# Patient Record
Sex: Male | Born: 1955 | Race: White | Hispanic: No | Smoking: Former smoker
Health system: Southern US, Community
[De-identification: ages and names within clinical notes are randomized; demographics above are authoritative.]

## PROBLEM LIST (undated history)

## (undated) DIAGNOSIS — G5600 Carpal tunnel syndrome, unspecified upper limb: Secondary | ICD-10-CM

## (undated) DIAGNOSIS — I480 Paroxysmal atrial fibrillation: Secondary | ICD-10-CM

## (undated) DIAGNOSIS — Z87442 Personal history of urinary calculi: Secondary | ICD-10-CM

## (undated) DIAGNOSIS — K4090 Unilateral inguinal hernia, without obstruction or gangrene, not specified as recurrent: Secondary | ICD-10-CM

## (undated) DIAGNOSIS — C61 Malignant neoplasm of prostate: Secondary | ICD-10-CM

## (undated) DIAGNOSIS — N289 Disorder of kidney and ureter, unspecified: Secondary | ICD-10-CM

## (undated) HISTORY — DX: Unilateral inguinal hernia, without obstruction or gangrene, not specified as recurrent: K40.90

## (undated) HISTORY — PX: ELBOW SURGERY: SHX618

## (undated) HISTORY — DX: Carpal tunnel syndrome, unspecified upper limb: G56.00

## (undated) HISTORY — PX: COLONOSCOPY: SHX174

## (undated) HISTORY — PX: KNEE ARTHROSCOPY: SHX127

## (undated) HISTORY — DX: Malignant neoplasm of prostate: C61

## (undated) HISTORY — DX: Paroxysmal atrial fibrillation: I48.0

---

## 2006-04-11 DIAGNOSIS — Z860100 Personal history of colon polyps, unspecified: Secondary | ICD-10-CM | POA: Insufficient documentation

## 2011-05-04 HISTORY — PX: PROSTATE BIOPSY: SHX241

## 2011-05-15 ENCOUNTER — Telehealth: Payer: Self-pay | Admitting: Internal Medicine

## 2011-05-15 NOTE — Telephone Encounter (Signed)
Forwarded to Dr. Leone Payor for appt referral.

## 2011-05-17 ENCOUNTER — Encounter: Payer: Self-pay | Admitting: Internal Medicine

## 2011-05-25 ENCOUNTER — Encounter: Payer: Self-pay | Admitting: Internal Medicine

## 2011-05-25 ENCOUNTER — Ambulatory Visit (AMBULATORY_SURGERY_CENTER): Payer: 59 | Admitting: *Deleted

## 2011-05-25 VITALS — Ht 68.0 in | Wt 155.0 lb

## 2011-05-25 DIAGNOSIS — Z86010 Personal history of colonic polyps: Secondary | ICD-10-CM

## 2011-05-25 MED ORDER — PEG-KCL-NACL-NASULF-NA ASC-C 100 G PO SOLR: ORAL | Status: DC

## 2011-05-25 NOTE — Progress Notes (Signed)
PCP:  Enzo Montgomery. Buford Dresser, MD            [phone]            [address]             [place], [place] [address]

## 2011-05-25 NOTE — Progress Notes (Signed)
Pt. States he vomited on the way home from his last colon

## 2011-06-04 ENCOUNTER — Other Ambulatory Visit: Payer: Self-pay | Admitting: Internal Medicine

## 2011-06-28 ENCOUNTER — Emergency Department (HOSPITAL_COMMUNITY)
Admission: EM | Admit: 2011-06-28 | Discharge: 2011-06-29 | Disposition: A | Discharge status: Home / Self Care | Payer: 59 | Attending: Emergency Medicine | Admitting: Emergency Medicine

## 2011-06-28 ENCOUNTER — Emergency Department (HOSPITAL_COMMUNITY): Payer: 59

## 2011-06-28 DIAGNOSIS — R109 Unspecified abdominal pain: Secondary | ICD-10-CM | POA: Insufficient documentation

## 2011-06-28 DIAGNOSIS — R112 Nausea with vomiting, unspecified: Secondary | ICD-10-CM | POA: Insufficient documentation

## 2011-06-28 DIAGNOSIS — N201 Calculus of ureter: Secondary | ICD-10-CM | POA: Insufficient documentation

## 2011-06-28 DIAGNOSIS — R300 Dysuria: Secondary | ICD-10-CM | POA: Insufficient documentation

## 2011-06-28 DIAGNOSIS — R1032 Left lower quadrant pain: Secondary | ICD-10-CM | POA: Insufficient documentation

## 2011-06-28 LAB — DIFFERENTIAL
Basophils Absolute: 0 10*3/uL (ref 0.0–0.1)
Basophils Relative: 0 % (ref 0–1)
Eosinophils Absolute: 0.1 10*3/uL (ref 0.0–0.7)
Lymphs Abs: 1 10*3/uL (ref 0.7–4.0)
Neutrophils Relative %: 84 % — ABNORMAL HIGH (ref 43–77)

## 2011-06-28 LAB — CBC
MCH: 33.6 pg (ref 26.0–34.0)
MCV: 90.4 fL (ref 78.0–100.0)
Platelets: 169 10*3/uL (ref 150–400)
RBC: 5 MIL/uL (ref 4.22–5.81)
RDW: 12.6 % (ref 11.5–15.5)
WBC: 10.4 10*3/uL (ref 4.0–10.5)

## 2011-06-28 LAB — COMPREHENSIVE METABOLIC PANEL
AST: 24 U/L (ref 0–37)
Albumin: 4.4 g/dL (ref 3.5–5.2)
BUN: 20 mg/dL (ref 6–23)
CO2: 22 mEq/L (ref 19–32)
Calcium: 10.3 mg/dL (ref 8.4–10.5)
Creatinine, Ser: 1.35 mg/dL (ref 0.50–1.35)
GFR calc non Af Amer: 55 mL/min — ABNORMAL LOW (ref >60)

## 2011-06-28 LAB — URINE MICROSCOPIC-ADD ON

## 2011-06-28 LAB — URINALYSIS, ROUTINE W REFLEX MICROSCOPIC
Leukocytes, UA: NEGATIVE
Nitrite: NEGATIVE
Specific Gravity, Urine: 1.015 (ref 1.005–1.030)
Urobilinogen, UA: 1 mg/dL (ref 0.0–1.0)
pH: 8 (ref 5.0–8.0)

## 2011-06-28 LAB — LIPASE, BLOOD: Lipase: 19 U/L (ref 11–59)

## 2011-07-16 ENCOUNTER — Encounter: Payer: Self-pay | Admitting: Internal Medicine

## 2011-07-16 ENCOUNTER — Ambulatory Visit (AMBULATORY_SURGERY_CENTER): Payer: 59 | Admitting: Internal Medicine

## 2011-07-16 DIAGNOSIS — C61 Malignant neoplasm of prostate: Secondary | ICD-10-CM

## 2011-07-16 DIAGNOSIS — Z86010 Personal history of colonic polyps: Secondary | ICD-10-CM

## 2011-07-16 DIAGNOSIS — Z1211 Encounter for screening for malignant neoplasm of colon: Secondary | ICD-10-CM

## 2011-07-16 DIAGNOSIS — K4090 Unilateral inguinal hernia, without obstruction or gangrene, not specified as recurrent: Secondary | ICD-10-CM

## 2011-07-16 DIAGNOSIS — I480 Paroxysmal atrial fibrillation: Secondary | ICD-10-CM

## 2011-07-16 MED ORDER — SODIUM CHLORIDE 0.9 % IV SOLN: 500.0000 mL | INTRAVENOUS | Status: DC

## 2011-07-16 NOTE — Patient Instructions (Addendum)
No polyps or cancer seen today. You should have a routine repeat colonoscopy in 5 years.  Iva Boop, MD, [facility]  Discharge instructions given per blue and green sheets  Repeat exam in 5 years per dr Leone Payor.

## 2011-07-17 ENCOUNTER — Telehealth: Payer: Self-pay | Admitting: *Deleted

## 2011-07-17 NOTE — Telephone Encounter (Signed)
No answer. No ID on answering machine. 

## 2011-09-18 ENCOUNTER — Encounter (HOSPITAL_COMMUNITY): Payer: 59

## 2011-09-18 ENCOUNTER — Encounter (HOSPITAL_COMMUNITY): Payer: Self-pay

## 2011-09-18 LAB — BASIC METABOLIC PANEL
GFR calc Af Amer: >90 mL/min (ref >90)
GFR calc non Af Amer: >90 mL/min (ref >90)
Potassium: 4.6 mEq/L (ref 3.5–5.1)
Sodium: 138 mEq/L (ref 135–145)

## 2011-09-18 LAB — SURGICAL PCR SCREEN: Staphylococcus aureus: NEGATIVE

## 2011-09-18 LAB — CBC
MCHC: 35.1 g/dL (ref 30.0–36.0)
Platelets: 169 10*3/uL (ref 150–400)
RDW: 12.5 % (ref 11.5–15.5)

## 2011-09-18 NOTE — Patient Instructions (Addendum)
DATE OF SURGERY: 09/24/11  TIME OF ARRIVAL TO SHORT STAY DEPT. :  5:15AM  NO FOOD AFTER :  MIDNIGHT  NO LIQUIDS AFTER : MIDNIGHT  LEAVE VALUABLES AT HOME (MONEY , JEWELERY) LEAVE LUGGAGE IN CAR  DAY OF DISCHARGE - DISCHARGE TIME IS 11:00AM PATIENTS  WILL NOT BE ALLOWED TO DRIVE HOME  BRING INCENTIVE SPIROMETER TO HOSPITAL  SHOWER WITH BETASEPT THE NIGHT BEFORE SURGERY  AND THE MORNING OF SURGERY  FOLLOW BOWEL PREP INSTRUCTIONS    PATIENT SIGNATURE:___________________________________________________    RN SIGNATURE:__________________________________________________________    DATE / TIME:_________________________________________________

## 2011-09-23 ENCOUNTER — Encounter: Payer: Self-pay | Admitting: Urology

## 2011-09-23 NOTE — H&P (Signed)
Chief Complaint  Prostate Cancer   History of Present Illness  Ricardo [name] is a 55 year old who was noted to have an elevated PSA of 4.8 during a routine physical exam which represented an increase from 2.2 in 2011.  His PSA was repeated by Dr. Retta Diones and remained elevated at 4.2 prompting a prostate biopsy on 05/04/11 which confirmed Gleason 3+3=6 adenocarcinoma of the prostate in 6 out of 12 biopsy cores. He has a paternal family history of prostate cancer. His father was diagnosed in his early 21s and underwent radical prostatectomy and is currently living in cancer free at age 74. Ricardo [name] is well infomed about his treatment options and is most interested in surgical treatment.  He does have a history of atrial fibrillation which was diagnosed approximately 4 years ago. He is under the care of Dr. Ramond Dial in [place], [place]. He is treated chronically with dronedarone (Multaq). He was recently seen within the last few weeks and no changes were made in his medical regimen.  TNM stage: cT1c Nx Mx PSA: 4.2 Gleason score: 3+3=6 Biopsy (05/04/11): 6/12 cores -- L lateral apex (10%), L lateral mid (10%), R apex (5%), R lateral apex (40%), R mid (10%, R lateral base (15%) Prostate volume: 31.9 cc  Nomogram: OC disease: 89% EPE: 7% SVI: 1% LNI: 1.3% PFS: 97%, 95%  Urinary function: He does have moderate voiding symptoms including nocturia, urgency, and a weak stream. IPSS: 16/3. Erectile function: He denies erectile dysfunction. SHIM score: 25. He is divorced and currently not in a relationship. Erectile function is a definite priority for him.   Past Medical History Problems  1. History of  Atrial Fibrillation 427.31 2. Inguinal Hernia 550.90  Surgical History Problems  1. History of  Elbow Surgery Bilateral 2. History of  Knee Surgery  Current Meds 1. Aspirin 81 MG Oral Tablet; Therapy: (Recorded:17May2012) to 2. Multaq 400 MG Oral Tablet; Therapy: (Recorded:17May2012)  to 3. Multi-Vitamin Oral Tablet; Therapy: (Recorded:17May2012) to 4. Osteo Bi-Flex Joint Shield TABS; Therapy: (Recorded:17May2012) to 5. Saw Palmetto CAPS; Therapy: (Recorded:17May2012) to  Allergies Medication  1. No Known Drug Allergies  Family History Problems  1. Maternal history of  Heart Disease V17.49 2. Paternal history of  Prostate Cancer V16.42  Social History Problems    Alcohol Use almost everyday   Former Smoker V15.82 smoked for 30 yrs and quit in 2008   Marital History - Divorced V61.03  Review of Systems Constitutional, skin, eye, otolaryngeal, hematologic/lymphatic, cardiovascular, pulmonary, endocrine, musculoskeletal, gastrointestinal, neurological and psychiatric system(s) were reviewed and pertinent findings if present are noted.    Vitals Vital Signs [Data Includes: Last 1 Day]  07Aug2012 08:19AM  BMI Calculated: 23.49 BSA Calculated: 1.84 Weight: 155 lb  Blood Pressure: 118 / 68 Heart Rate: 64 Respiration: 14  Physical Exam Constitutional: Well nourished and well developed . No acute distress.  ENT:. The ears and nose are normal in appearance.  Neck: The appearance of the neck is normal and no neck mass is present.  Pulmonary: No respiratory distress, normal respiratory rhythm and effort and clear bilateral breath sounds.  Cardiovascular: Heart rate and rhythm are normal . No peripheral edema.  Abdomen: The abdomen is soft and nontender. No masses are palpated. No CVA tenderness. No hernias are palpable. No hepatosplenomegaly noted.  Neuro/Psych:. Mood and affect are appropriate.    Results/Data  I've independently reviewed his medical records, pathology report, and PSA results with findings as dictated above.     Assessment  Assessed  1. Prostate Cancer 185  Plan   1. Prostate cancer: RAL radical prostatectomy and BPLND.

## 2011-09-24 ENCOUNTER — Encounter (HOSPITAL_COMMUNITY): Payer: Self-pay | Admitting: *Deleted

## 2011-09-24 ENCOUNTER — Inpatient Hospital Stay (HOSPITAL_COMMUNITY)
Admission: RE | Admit: 2011-09-24 | Discharge: 2011-09-25 | DRG: 708 | Disposition: A | Discharge status: Home / Self Care | Payer: 59 | Source: Ambulatory Visit | Attending: Urology | Admitting: Urology

## 2011-09-24 ENCOUNTER — Other Ambulatory Visit: Payer: Self-pay | Admitting: Urology

## 2011-09-24 ENCOUNTER — Encounter (HOSPITAL_COMMUNITY)
Admission: RE | Disposition: A | Discharge status: Home / Self Care | Payer: Self-pay | Source: Ambulatory Visit | Attending: Urology

## 2011-09-24 ENCOUNTER — Inpatient Hospital Stay (HOSPITAL_COMMUNITY): Payer: 59 | Admitting: *Deleted

## 2011-09-24 DIAGNOSIS — Z01812 Encounter for preprocedural laboratory examination: Secondary | ICD-10-CM

## 2011-09-24 DIAGNOSIS — C61 Malignant neoplasm of prostate: Principal | ICD-10-CM | POA: Diagnosis present

## 2011-09-24 HISTORY — PX: ROBOT ASSISTED LAPAROSCOPIC RADICAL PROSTATECTOMY: SHX5141

## 2011-09-24 LAB — ABO/RH: ABO/RH(D): A NEG

## 2011-09-24 LAB — TYPE AND SCREEN
ABO/RH(D): A NEG
Antibody Screen: NEGATIVE

## 2011-09-24 LAB — HEMOGLOBIN AND HEMATOCRIT, BLOOD: Hemoglobin: 15.7 g/dL (ref 13.0–17.0)

## 2011-09-24 SURGERY — ROBOTIC ASSISTED LAPAROSCOPIC RADICAL PROSTATECTOMY LEVEL 1
Anesthesia: General | Site: Abdomen | Wound class: Clean Contaminated

## 2011-09-24 MED ORDER — SODIUM CHLORIDE 0.9 % IV BOLUS (SEPSIS)
1000.0000 mL | Freq: Once | INTRAVENOUS | Status: AC
  Administered 2011-09-24: 1000 mL via INTRAVENOUS

## 2011-09-24 MED ORDER — PROMETHAZINE HCL 25 MG/ML IJ SOLN: 6.2500 mg | INTRAMUSCULAR | Status: DC | PRN

## 2011-09-24 MED ORDER — HYDROMORPHONE HCL PF 1 MG/ML IJ SOLN
INTRAMUSCULAR | Status: AC
  Filled 2011-09-24: qty 1

## 2011-09-24 MED ORDER — DRONEDARONE HCL 400 MG PO TABS
400.0000 mg | ORAL_TABLET | Freq: Two times a day (BID) | ORAL | Status: DC
  Administered 2011-09-24 – 2011-09-25 (×2): 400 mg via ORAL
  Filled 2011-09-24 (×3): qty 1

## 2011-09-24 MED ORDER — HEPARIN SODIUM (PORCINE) 1000 UNIT/ML IJ SOLN
INTRAMUSCULAR | Status: DC | PRN
  Administered 2011-09-24: 1000 [IU] via INTRAPERITONEAL

## 2011-09-24 MED ORDER — DEXAMETHASONE SODIUM PHOSPHATE 10 MG/ML IJ SOLN
INTRAMUSCULAR | Status: DC | PRN
  Administered 2011-09-24: 10 mg via INTRAVENOUS

## 2011-09-24 MED ORDER — CEFAZOLIN SODIUM 1-5 GM-% IV SOLN
INTRAVENOUS | Status: DC | PRN
  Administered 2011-09-24: 1 g via INTRAVENOUS

## 2011-09-24 MED ORDER — ROCURONIUM BROMIDE 100 MG/10ML IV SOLN
INTRAVENOUS | Status: DC | PRN
  Administered 2011-09-24: 50 mg via INTRAVENOUS
  Administered 2011-09-24: 10 mg via INTRAVENOUS

## 2011-09-24 MED ORDER — ZOLPIDEM TARTRATE 5 MG PO TABS: 5.0000 mg | ORAL_TABLET | Freq: Every evening | ORAL | Status: DC | PRN

## 2011-09-24 MED ORDER — FENTANYL CITRATE 0.05 MG/ML IJ SOLN
INTRAMUSCULAR | Status: DC | PRN
  Administered 2011-09-24: 100 ug via INTRAVENOUS
  Administered 2011-09-24: 150 ug via INTRAVENOUS

## 2011-09-24 MED ORDER — INDIGOTINDISULFONATE SODIUM 8 MG/ML IJ SOLN
INTRAMUSCULAR | Status: DC | PRN
  Administered 2011-09-24 (×2): 5 mL via INTRAVENOUS

## 2011-09-24 MED ORDER — MIDAZOLAM HCL 5 MG/5ML IJ SOLN
INTRAMUSCULAR | Status: DC | PRN
  Administered 2011-09-24: 2 mg via INTRAVENOUS

## 2011-09-24 MED ORDER — ACETAMINOPHEN 10 MG/ML IV SOLN
INTRAVENOUS | Status: DC | PRN
  Administered 2011-09-24: 1000 mg via INTRAVENOUS

## 2011-09-24 MED ORDER — LACTATED RINGERS IV SOLN
INTRAVENOUS | Status: DC | PRN
  Administered 2011-09-24 (×4): via INTRAVENOUS

## 2011-09-24 MED ORDER — INDIGOTINDISULFONATE SODIUM 8 MG/ML IJ SOLN
INTRAMUSCULAR | Status: AC
  Filled 2011-09-24: qty 10

## 2011-09-24 MED ORDER — ACETAMINOPHEN 10 MG/ML IV SOLN
INTRAVENOUS | Status: AC
  Filled 2011-09-24: qty 100

## 2011-09-24 MED ORDER — DIPHENHYDRAMINE HCL 50 MG/ML IJ SOLN: 12.5000 mg | Freq: Four times a day (QID) | INTRAMUSCULAR | Status: DC | PRN

## 2011-09-24 MED ORDER — ONDANSETRON HCL 4 MG/2ML IJ SOLN: 4.0000 mg | INTRAMUSCULAR | Status: DC | PRN

## 2011-09-24 MED ORDER — NEOSTIGMINE METHYLSULFATE 1 MG/ML IJ SOLN
INTRAMUSCULAR | Status: DC | PRN
  Administered 2011-09-24: 4 mg via INTRAVENOUS

## 2011-09-24 MED ORDER — SODIUM CHLORIDE 0.9 % IR SOLN
Status: DC | PRN
  Administered 2011-09-24: 200 mL

## 2011-09-24 MED ORDER — HEPARIN SODIUM (PORCINE) 1000 UNIT/ML IJ SOLN
INTRAMUSCULAR | Status: AC
  Filled 2011-09-24: qty 1

## 2011-09-24 MED ORDER — MORPHINE SULFATE 4 MG/ML IJ SOLN
INTRAMUSCULAR | Status: AC
  Administered 2011-09-24: 4 mg
  Filled 2011-09-24: qty 1

## 2011-09-24 MED ORDER — BUPIVACAINE-EPINEPHRINE 0.25% -1:200000 IJ SOLN
INTRAMUSCULAR | Status: DC | PRN
  Administered 2011-09-24: 40 mL

## 2011-09-24 MED ORDER — LIDOCAINE HCL (CARDIAC) 20 MG/ML IV SOLN
INTRAVENOUS | Status: DC | PRN
  Administered 2011-09-24: 100 mg via INTRAVENOUS

## 2011-09-24 MED ORDER — HYDROMORPHONE HCL PF 1 MG/ML IJ SOLN: 0.2500 mg | INTRAMUSCULAR | Status: DC | PRN

## 2011-09-24 MED ORDER — DIPHENHYDRAMINE HCL 12.5 MG/5ML PO ELIX: 12.5000 mg | ORAL_SOLUTION | Freq: Four times a day (QID) | ORAL | Status: DC | PRN

## 2011-09-24 MED ORDER — DOCUSATE SODIUM 100 MG PO CAPS
100.0000 mg | ORAL_CAPSULE | Freq: Two times a day (BID) | ORAL | Status: DC
  Administered 2011-09-25 (×2): 100 mg via ORAL
  Filled 2011-09-24 (×3): qty 1

## 2011-09-24 MED ORDER — ONDANSETRON HCL 4 MG/2ML IJ SOLN
INTRAMUSCULAR | Status: DC | PRN
  Administered 2011-09-24: 4 mg via INTRAVENOUS

## 2011-09-24 MED ORDER — CEFAZOLIN SODIUM 1-5 GM-% IV SOLN
INTRAVENOUS | Status: AC
  Filled 2011-09-24: qty 50

## 2011-09-24 MED ORDER — HYDROMORPHONE HCL PF 1 MG/ML IJ SOLN
INTRAMUSCULAR | Status: AC
  Administered 2011-09-24: 0.5 mg via INTRAVENOUS
  Filled 2011-09-24: qty 1

## 2011-09-24 MED ORDER — ACETAMINOPHEN 325 MG PO TABS
650.0000 mg | ORAL_TABLET | ORAL | Status: DC | PRN
  Administered 2011-09-25: 650 mg via ORAL
  Filled 2011-09-24: qty 2

## 2011-09-24 MED ORDER — CEFAZOLIN SODIUM 1-5 GM-% IV SOLN
1.0000 g | INTRAVENOUS | Status: DC
  Filled 2011-09-24: qty 50

## 2011-09-24 MED ORDER — KETOROLAC TROMETHAMINE 15 MG/ML IJ SOLN
15.0000 mg | Freq: Four times a day (QID) | INTRAMUSCULAR | Status: DC
  Administered 2011-09-24 – 2011-09-25 (×3): 15 mg via INTRAVENOUS
  Filled 2011-09-24 (×8): qty 1

## 2011-09-24 MED ORDER — KCL IN DEXTROSE-NACL 20-5-0.45 MEQ/L-%-% IV SOLN
INTRAVENOUS | Status: DC
  Administered 2011-09-25 (×2): via INTRAVENOUS
  Filled 2011-09-24 (×10): qty 1000

## 2011-09-24 MED ORDER — GLYCOPYRROLATE 0.2 MG/ML IJ SOLN
INTRAMUSCULAR | Status: DC | PRN
  Administered 2011-09-24: .6 mg via INTRAVENOUS

## 2011-09-24 MED ORDER — PROPOFOL 10 MG/ML IV EMUL
INTRAVENOUS | Status: DC | PRN
  Administered 2011-09-24: 200 mg via INTRAVENOUS

## 2011-09-24 MED ORDER — KCL IN DEXTROSE-NACL 20-5-0.45 MEQ/L-%-% IV SOLN
INTRAVENOUS | Status: AC
  Administered 2011-09-24: 1000 mL via INTRAVENOUS
  Filled 2011-09-24: qty 1000

## 2011-09-24 MED ORDER — MORPHINE SULFATE 2 MG/ML IJ SOLN
2.0000 mg | INTRAMUSCULAR | Status: DC | PRN
  Administered 2011-09-24: 2 mg via INTRAVENOUS
  Administered 2011-09-24: 4 mg via INTRAVENOUS
  Administered 2011-09-25 (×2): 2 mg via INTRAVENOUS
  Filled 2011-09-24 (×5): qty 1

## 2011-09-24 MED ORDER — HYDROMORPHONE HCL PF 1 MG/ML IJ SOLN
0.2500 mg | INTRAMUSCULAR | Status: DC | PRN
  Administered 2011-09-24 (×3): 0.5 mg via INTRAVENOUS

## 2011-09-24 MED ORDER — HYDROMORPHONE HCL PF 1 MG/ML IJ SOLN
INTRAMUSCULAR | Status: DC | PRN
  Administered 2011-09-24 (×2): 1 mg via INTRAVENOUS

## 2011-09-24 MED ORDER — BUPIVACAINE-EPINEPHRINE 0.25% -1:200000 IJ SOLN
INTRAMUSCULAR | Status: AC
  Filled 2011-09-24: qty 1

## 2011-09-24 MED ORDER — CEFAZOLIN SODIUM 1-5 GM-% IV SOLN
1.0000 g | Freq: Three times a day (TID) | INTRAVENOUS | Status: AC
  Administered 2011-09-24 – 2011-09-25 (×2): 1 g via INTRAVENOUS
  Filled 2011-09-24: qty 50

## 2011-09-24 SURGICAL SUPPLY — 36 items
CANISTER SUCTION 2500CC (MISCELLANEOUS) ×2 IMPLANT
CATH ROBINSON RED A/P 8FR (CATHETERS) ×2 IMPLANT
CHLORAPREP W/TINT 26ML (MISCELLANEOUS) ×2 IMPLANT
CLIP LIGATING HEM O LOK PURPLE (MISCELLANEOUS) ×3 IMPLANT
CLOTH BEACON ORANGE TIMEOUT ST (SAFETY) ×2 IMPLANT
CORD HIGH FREQUENCY UNIPOLAR (ELECTROSURGICAL) ×1 IMPLANT
COVER SURGICAL LIGHT HANDLE (MISCELLANEOUS) ×2 IMPLANT
COVER TIP SHEARS 8 DVNC (MISCELLANEOUS) ×1 IMPLANT
COVER TIP SHEARS 8MM DA VINCI (MISCELLANEOUS) ×1
CUTTER LINEAR FLEX ECHELON 45 (STAPLE) ×2 IMPLANT
DECANTER SPIKE VIAL GLASS SM (MISCELLANEOUS) ×1 IMPLANT
DRAPE SURG IRRIG POUCH 19X23 (DRAPES) ×2 IMPLANT
DRAPE UTILITY XL STRL (DRAPES) ×2 IMPLANT
DRSG TEGADERM 6X8 (GAUZE/BANDAGES/DRESSINGS) ×4 IMPLANT
ELECT REM PT RETURN 9FT ADLT (ELECTROSURGICAL) ×2
ELECTRODE REM PT RTRN 9FT ADLT (ELECTROSURGICAL) ×1 IMPLANT
GLOVE BIOGEL M STRL SZ7.5 (GLOVE) ×4 IMPLANT
GOWN STRL NON-REIN LRG LVL3 (GOWN DISPOSABLE) ×6 IMPLANT
HOLDER FOLEY CATH W/STRAP (MISCELLANEOUS) ×2 IMPLANT
IV LACTATED RINGERS 1000ML (IV SOLUTION) ×2 IMPLANT
KIT ACCESSORY DA VINCI DISP (KITS) ×1
KIT ACCESSORY DVNC DISP (KITS) ×1 IMPLANT
NDL SAFETY ECLIPSE 18X1.5 (NEEDLE) ×1 IMPLANT
NEEDLE HYPO 18GX1.5 SHARP (NEEDLE) ×2
PACK ROBOT UROLOGY CUSTOM (CUSTOM PROCEDURE TRAY) ×2 IMPLANT
POSITIONER SURGICAL ARM (MISCELLANEOUS) ×4 IMPLANT
RELOAD GREEN ECHELON 45 (STAPLE) ×2 IMPLANT
SET TUBE IRRIG SUCTION NO TIP (IRRIGATION / IRRIGATOR) ×2 IMPLANT
SOLUTION ELECTROLUBE (MISCELLANEOUS) ×2 IMPLANT
SPONGE GAUZE 4X4 12PLY (GAUZE/BANDAGES/DRESSINGS) ×2 IMPLANT
SUT MNCRL 3 0 RB1 (SUTURE) IMPLANT
SUT MONOCRYL 3 0 RB1 (SUTURE) ×1
SUT VICRYL 0 UR6 27IN ABS (SUTURE) ×4 IMPLANT
SYR 27GX1/2 1ML LL SAFETY (SYRINGE) ×2 IMPLANT
TOWEL OR NON WOVEN STRL DISP B (DISPOSABLE) ×2 IMPLANT
WATER STERILE IRR 1500ML POUR (IV SOLUTION) ×4 IMPLANT

## 2011-09-24 NOTE — Preoperative (Signed)
Beta Blockers   Reason not to administer Beta Blockers:Not Applicable 

## 2011-09-24 NOTE — Anesthesia Preprocedure Evaluation (Addendum)
Anesthesia Evaluation  Patient identified by MRN, date of birth, ID band Patient awake    Reviewed: Allergy & Precautions, H&P , NPO status , Patient's Chart, lab work & pertinent test results  History of Anesthesia Complications (+) PONV  Airway Mallampati: I TM Distance: >3 FB Neck ROM: Full    Dental  (+) Teeth Intact, Caps and Dental Advisory Given   Pulmonary neg pulmonary ROS,    Pulmonary exam normal       Cardiovascular Regular Normal A Fib   Neuro/Psych    GI/Hepatic negative GI ROS, Neg liver ROS,   Endo/Other  Negative Endocrine ROS  Renal/GU negative Renal ROS     Musculoskeletal negative musculoskeletal ROS (+)   Abdominal   Peds  Hematology negative hematology ROS (+)   Anesthesia Other Findings Caps in back  Reproductive/Obstetrics                          Anesthesia Physical Anesthesia Plan  ASA: III  Anesthesia Plan: General   Post-op Pain Management:    Induction: Intravenous  Airway Management Planned: Oral ETT  Additional Equipment:   Intra-op Plan:   Post-operative Plan: Extubation in OR  Informed Consent: I have reviewed the patients History and Physical, chart, labs and discussed the procedure including the risks, benefits and alternatives for the proposed anesthesia with the patient or authorized representative who has indicated his/her understanding and acceptance.   Dental advisory given  Plan Discussed with: CRNA and Surgeon  Anesthesia Plan Comments:        Anesthesia Quick Evaluation

## 2011-09-24 NOTE — Anesthesia Procedure Notes (Signed)
Procedures

## 2011-09-24 NOTE — Progress Notes (Signed)
Request to adjust antibiotics based on renal function. Order for Ancef 1gm IV q8h x 2 doses post-op. Renal function is appropriate for Ancef dose. No changes needed.

## 2011-09-24 NOTE — Progress Notes (Signed)
Post-op note  Subjective: The patient is doing well.  No complaints.  Objective: Vital signs in last 24 hours: Temp:  [97.2 F (36.2 C)-98 F (36.7 C)] 97.8 F (36.6 C) (11/05 1600) Pulse Rate:  [62-91] 86  (11/05 1600) Resp:  [9-74] 16  (11/05 1600) BP: (92-160)/(68-97) 130/71 mmHg (11/05 1600) SpO2:  [91 %-100 %] 97 % (11/05 1600) Weight:  [71.215 kg (157 lb)-75 kg (165 lb 5.5 oz)] 165 lb 5.5 oz (75 kg) (11/05 1600)  Intake/Output from previous day:   Intake/Output this shift:    Physical Exam:  General: Alert and oriented. Abdomen: Soft, Nondistended. Incisions: Clean and dry.  Lab Results:  Basename 09/24/11 1133  HGB 15.7  HCT 45.5    Assessment/Plan: POD#0   1) Continue to monitor   Moody Bruins. MD   LOS: 0 days   Graylin Sperling,LES 09/24/2011, 8:34 PM

## 2011-09-24 NOTE — Anesthesia Postprocedure Evaluation (Signed)
  Anesthesia Post-op Note  Patient: Ricardo [name]  Procedure(s) Performed:  ROBOTIC ASSISTED LAPAROSCOPIC RADICAL PROSTATECTOMY LEVEL 1  Patient Location: PACU  Anesthesia Type: General  Level of Consciousness: awake and sedated  Airway and Oxygen Therapy: Patient Spontanous Breathing and Patient connected to nasal cannula oxygen  Post-op Pain: mild  Post-op Assessment: Post-op Vital signs reviewed, Patient's Cardiovascular Status Stable and Respiratory Function Stable  Post-op Vital Signs: stable  Complications: No apparent anesthesia complications

## 2011-09-24 NOTE — Op Note (Signed)
Preoperative diagnosis: Clinically localized adenocarcinoma of the prostate (clinical stage T1c Nx Mx)  Postoperative diagnosis: Clinically localized adenocarcinoma of the prostate (clinical stage T1c Nx Mx)  Procedure:  1. Robotic assisted laparoscopic radical prostatectomy (bilateral nerve sparing)  Surgeon: Rolly Salter, Montez Hageman. M.D.  Assistant: Natalia Leatherwood, MD  Anesthesia: General  Complications: None  EBL: 100 mL  IVF:  1500 mL crystalloid  Specimens: 1. Prostate and seminal vesicles  Disposition of specimens: Pathology  Drains: 1. 20 Fr coude catheter 2. # 19 Blake pelvic drain  Indication: Mr. Diloreto is a patient with clinically localized prostate cancer.  After a thorough review of the management options for treatment of prostate cancer, he elected to proceed with surgical therapy and the above procedure(s).  We have discussed the potential benefits and risks of the procedure, side effects of the proposed treatment, the likelihood of the patient achieving the goals of the procedure, and any potential problems that might occur during the procedure or recuperation. Informed consent has been obtained.  Description of procedure:  The patient was taken to the operating room and a general anesthetic was administered. He was given preoperative antibiotics, placed in the dorsal lithotomy position, and prepped and draped in the usual sterile fashion. Next a preoperative timeout was performed. A urethral catheter was placed into the bladder and a site was selected near the umbilicus for placement of the camera port. This was placed using a standard open Hassan technique which allowed entry into the peritoneal cavity under direct vision and without difficulty. A 12 mm port was placed and a pneumoperitoneum established. The camera was then used to inspect the abdomen and there was no evidence of any intra-abdominal injuries or other abnormalities. The remaining abdominal ports were  then placed. 8 mm robotic ports were placed in the right lower quadrant, left lower quadrant, and far left lateral abdominal wall. A 5 mm port was placed in the right upper quadrant and a 12 mm port was placed in the right lateral abdominal wall for laparoscopic assistance. All ports were placed under direct vision without difficulty. The surgical cart was then docked.   Utilizing the cautery scissors, the bladder was reflected posteriorly allowing entry into the space of Retzius and identification of the endopelvic fascia and prostate. The periprostatic fat was then removed from the prostate allowing full exposure of the endopelvic fascia. The endopelvic fascia was then incised from the apex back to the base of the prostate bilaterally and the underlying levator muscle fibers were swept laterally off the prostate thereby isolating the dorsal venous complex. The dorsal vein was then stapled and divided with a 45 mm Flex Echelon stapler. Attention then turned to the bladder neck which was divided anteriorly thereby allowing entry into the bladder and exposure of the urethral catheter. The catheter balloon was deflated and the catheter was brought into the operative field and used to retract the prostate anteriorly. The posterior bladder neck was then examined and was divided allowing further dissection between the bladder and prostate posteriorly until the vasa deferentia and seminal vessels were identified. The vasa deferentia were isolated, divided, and lifted anteriorly. The seminal vesicles were dissected down to their tips with care to control the seminal vascular arterial blood supply. These structures were then lifted anteriorly and the space between Denonvillier's fascia and the anterior rectum was developed with a combination of sharp and blunt dissection. This isolated the vascular pedicles of the prostate.  The lateral prostatic fascia was then sharply incised  allowing release of the neurovascular  bundles bilaterally. The vascular pedicles of the prostate were then ligated with Weck clips between the prostate and neurovascular bundles and divided with sharp cold scissor dissection resulting in neurovascular bundle preservation. The neurovascular bundles were then separated off the apex of the prostate and urethra bilaterally.  The urethra was then sharply transected allowing the prostate specimen to be disarticulated. The pelvis was copiously irrigated and hemostasis was ensured. There was no evidence for rectal injury.  Attention then turned to the urethral anastomosis. A 2-0 Vicryl slip knot was placed between Denonvillier's fascia, the posterior bladder neck, and the posterior urethra to reapproximate these structures. A double-armed 3-0 Monocryl suture was then used to perform a 360 running tension-free anastomosis between the bladder neck and urethra. A new urethral catheter was then placed into the bladder and irrigated. There were no blood clots within the bladder and the anastomosis appeared to be watertight. A #19 Blake drain was then brought through the left lateral 8 mm port site and positioned appropriately within the pelvis. It was secured to the skin with a nylon suture. The surgical cart was then undocked. The right lateral 12 mm port site was closed at the fascial level with a 0 Vicryl suture placed laparoscopically. All remaining ports were then removed under direct vision. The prostate specimen was removed intact within the Endopouch retrieval bag via the periumbilical camera port site. This fascial opening was closed with two running 0 Vicryl sutures. 0.25% Marcaine was then injected into all port sites and all incisions were reapproximated at the skin level with staples. Sterile dressings were applied. The patient appeared to tolerate the procedure well and without complications. The patient was able to be extubated and transferred to the recovery unit in satisfactory condition.

## 2011-09-24 NOTE — Anesthesia Postprocedure Evaluation (Signed)
  Anesthesia Post-op Note  Patient: Ricardo [name]  Procedure(s) Performed:  ROBOTIC ASSISTED LAPAROSCOPIC RADICAL PROSTATECTOMY LEVEL 1  Patient Location: PACU  Anesthesia Type: General  Level of Consciousness: awake and alert   Airway and Oxygen Therapy: Patient Spontanous Breathing and Patient connected to nasal cannula oxygen  Post-op Pain: mild  Post-op Assessment: Post-op Vital signs reviewed, Patient's Cardiovascular Status Stable, Respiratory Function Stable and Pain level controlled  Post-op Vital Signs: stable  Complications: No apparent anesthesia complications

## 2011-09-24 NOTE — Transfer of Care (Signed)
Immediate Anesthesia Transfer of Care Note  Patient: Ricardo [name]  Procedure(s) Performed:  ROBOTIC ASSISTED LAPAROSCOPIC RADICAL PROSTATECTOMY LEVEL 1  Patient Location: PACU  Anesthesia Type: General  Level of Consciousness: awake, alert , sedated and responds to stimulation  Airway & Oxygen Therapy: Patient Spontanous Breathing and Patient connected to face mask oxygen  Post-op Assessment: Report given to PACU RN  Post vital signs: stable  Complications: No apparent anesthesia complications

## 2011-09-24 NOTE — H&P (Signed)
  Chief Complaint  Prostate Cancer    History of Present Illness  [name] is a 55 year old who was noted to have an elevated PSA of 4.8 during a routine physical exam which represented an increase from 2.2 in 2011.  His PSA was repeated by Dr. Retta Diones and remained elevated at 4.2 prompting a prostate biopsy on 05/04/11 which confirmed Gleason 3+3=6 adenocarcinoma of the prostate in 6 out of 12 biopsy cores. He has a paternal family history of prostate cancer. His father was diagnosed in his early 29s and underwent radical prostatectomy and is currently living in cancer free at age 52. [name] is well infomed about his treatment options and is most interested in surgical treatment.  He does have a history of atrial fibrillation which was diagnosed approximately 4 years ago. He is under the care of Dr. Ramond Dial in [place], [place]. He is treated chronically with dronedarone (Multaq). He was recently seen within the last few weeks and no changes were made in his medical regimen.  TNM stage: cT1c Nx Mx PSA: 4.2 Gleason score: 3+3=6 Biopsy (05/04/11): 6/12 cores -- L lateral apex (10%), L lateral mid (10%), R apex (5%), R lateral apex (40%), R mid (10%, R lateral base (15%) Prostate volume: 31.9 cc  Nomogram: OC disease: 89% EPE: 7% SVI: 1% LNI: 1.3% PFS: 97%, 95%  Urinary function: He does have moderate voiding symptoms including nocturia, urgency, and a weak stream. IPSS: 16/3. Erectile function: He denies erectile dysfunction. SHIM score: 25. He is divorced and currently not in a relationship. Erectile function is a definite priority for him.   Past Medical History Problems  1. History of  Atrial Fibrillation 427.31 2. Inguinal Hernia 550.90  Surgical History Problems  1. History of  Elbow Surgery Bilateral 2. History of  Knee Surgery  Current Meds 1. Aspirin 81 MG Oral Tablet; Therapy: (Recorded:17May2012) to 2. Multaq 400 MG Oral Tablet; Therapy: (Recorded:17May2012)  to 3. Multi-Vitamin Oral Tablet; Therapy: (Recorded:17May2012) to 4. Osteo Bi-Flex Joint Shield TABS; Therapy: (Recorded:17May2012) to 5. Saw Palmetto CAPS; Therapy: (Recorded:17May2012) to  Allergies Medication  1. No Known Drug Allergies  Family History Problems  1. Maternal history of  Heart Disease V17.49 2. Paternal history of  Prostate Cancer V16.42  Social History Problems    Alcohol Use almost everyday   Former Smoker V15.82 smoked for 30 yrs and quit in 2008   Marital History - Divorced V61.03  Review of Systems Constitutional, skin, eye, otolaryngeal, hematologic/lymphatic, cardiovascular, pulmonary, endocrine, musculoskeletal, gastrointestinal, neurological and psychiatric system(s) were reviewed and pertinent findings if present are noted.      Physical Exam Constitutional: Well nourished and well developed . No acute distress.  ENT:. The ears and nose are normal in appearance.  Neck: The appearance of the neck is normal and no neck mass is present.  Pulmonary: No respiratory distress, normal respiratory rhythm and effort and clear bilateral breath sounds.  Cardiovascular: Heart rate and rhythm are normal . No peripheral edema.   Lymphatics: The femoral and inguinal nodes are not enlarged or tender.  Skin: Normal skin turgor, no visible rash and no visible skin lesions.  Neuro/Psych:. Mood and affect are appropriate.    Results/Data  I've independently reviewed his medical records, pathology report, and PSA results with findings as dictated above.     Assessment Assessed  1. Prostate Cancer 185  Plan   1. Prostate cancer:  RAL radical prostatectomy.

## 2011-09-25 LAB — HEMOGLOBIN AND HEMATOCRIT, BLOOD: Hemoglobin: 14.1 g/dL (ref 13.0–17.0)

## 2011-09-25 MED ORDER — DSS 100 MG PO CAPS: 100.0000 mg | ORAL_CAPSULE | Freq: Two times a day (BID) | ORAL | Status: AC

## 2011-09-25 MED ORDER — CIPROFLOXACIN HCL 500 MG PO TABS: 500.0000 mg | ORAL_TABLET | Freq: Two times a day (BID) | ORAL | Status: AC

## 2011-09-25 MED ORDER — BISACODYL 10 MG RE SUPP
10.0000 mg | Freq: Once | RECTAL | Status: AC
  Administered 2011-09-25: 10 mg via RECTAL
  Filled 2011-09-25: qty 1

## 2011-09-25 MED ORDER — HYDROCODONE-ACETAMINOPHEN 5-325 MG PO TABS: 1.0000 | ORAL_TABLET | Freq: Four times a day (QID) | ORAL | Status: AC | PRN

## 2011-09-25 MED ORDER — HYDROCODONE-ACETAMINOPHEN 5-325 MG PO TABS
1.0000 | ORAL_TABLET | Freq: Four times a day (QID) | ORAL | Status: DC | PRN
  Administered 2011-09-25: 2 via ORAL
  Filled 2011-09-25: qty 2

## 2011-09-25 MED ORDER — ONDANSETRON HCL 4 MG PO TABS: 4.0000 mg | ORAL_TABLET | Freq: Three times a day (TID) | ORAL | Status: AC | PRN

## 2011-09-25 NOTE — Progress Notes (Signed)
1 Day Post-Op Subjective: The patient is doing well.  No nausea or vomiting. Pain is adequately controlled.  Objective: Vital signs in last 24 hours: Temp:  [97.2 F (36.2 C)-97.9 F (36.6 C)] 97.4 F (36.3 C) (11/06 0617) Pulse Rate:  [62-91] 77  (11/06 0617) Resp:  [9-22] 20  (11/06 0617) BP: (92-160)/(66-97) 122/78 mmHg (11/06 0617) SpO2:  [91 %-100 %] 92 % (11/06 0617) Weight:  [75 kg (165 lb 5.5 oz)] 165 lb 5.5 oz (75 kg) (11/05 1600)  Intake/Output from previous day: 11/05 0701 - 11/06 0700 In: 5840 [P.O.:240; I.V.:4550; IV Piggyback:1050] Out: 2420 [Urine:2275; Drains:95; Blood:50] Intake/Output this shift:    Physical Exam:  General: Alert and oriented. CV: Regular rate. Lungs: Clear bilaterally. GI: Soft, Nondistended. Incisions: Dressings intact. Urine: Clear. Extremities: Nontender, no erythema, no edema.  Lab Results:  Basename 09/25/11 0507 09/24/11 1133  HGB 14.1 15.7  HCT 40.2 45.5    Assessment/Plan: POD# 1 s/p robotic prostatectomy.  1) SL IVF 2) Ambulate, Incentive spirometry 3) Transition to oral pain medication 4) Dulcolax suppository 5) D/C pelvic drain 6) Plan for likely discharge later today   Moody Bruins. MD   LOS: 1 day   Jene Oravec,LES 09/25/2011, 7:11 AM

## 2011-09-25 NOTE — Plan of Care (Signed)
Problem: Phase I Progression Outcomes Goal: Foley/JP patent Outcome: Completed/Met Date Met:  09/25/11 Patient d/c home with [facility], JP has been d/c'd prior to discharge.

## 2011-09-25 NOTE — Discharge Summary (Signed)
  Date of admission: 09/24/11  Date of discharge:09/25/11  Admission diagnosis: Prostate Cancer  Discharge diagnosis: Prostate Cancer  History and Physical: For full details, please see admission history and physical. Briefly, Mr. Palmero is a gentleman with localized prostate cancer.  After discussing management/treatment options, he elected to proceed with surgical treatment.  Hospital Course: Mr. Cumbie was taken to the operating room on 09/24/11 and underwent a robotic assisted laparoscopic radical prostatectomy. He tolerated this procedure well and without complications. Postoperatively, he was able to be transferred to a regular hospital room following recovery from anesthesia.  He was able to begin ambulating the night of surgery. He remained hemodynamically stable overnight.  His hemoglobin on postoperative day #1 was 12.  He had excellent urine output with appropriately minimal output from his pelvic drain and his pelvic drain was removed on POD #1.  He was transitioned to oral pain medication, tolerated a clear liquid diet, and had met all discharge criteria and was able to be discharged home later on POD#1.  Disposition: Home  Discharge instruction: He was instructed to be ambulatory but to refrain from heavy lifting, strenuous activity, or driving. He was instructed on urethral catheter care.  Discharge medications:  See medication reconciliation.  Followup: He will followup in 1 week for catheter removal and to discuss his surgical pathology results.

## 2011-09-27 ENCOUNTER — Encounter (HOSPITAL_COMMUNITY): Payer: Self-pay | Admitting: Urology

## 2011-11-16 ENCOUNTER — Other Ambulatory Visit (HOSPITAL_COMMUNITY): Payer: Self-pay | Admitting: Sports Medicine

## 2011-11-16 DIAGNOSIS — M25562 Pain in left knee: Secondary | ICD-10-CM

## 2013-10-21 ENCOUNTER — Ambulatory Visit (INDEPENDENT_AMBULATORY_CARE_PROVIDER_SITE_OTHER): Payer: 59 | Admitting: General Surgery

## 2013-10-21 ENCOUNTER — Encounter (INDEPENDENT_AMBULATORY_CARE_PROVIDER_SITE_OTHER): Payer: Self-pay

## 2013-10-21 ENCOUNTER — Telehealth (INDEPENDENT_AMBULATORY_CARE_PROVIDER_SITE_OTHER): Payer: Self-pay

## 2013-10-21 ENCOUNTER — Encounter (INDEPENDENT_AMBULATORY_CARE_PROVIDER_SITE_OTHER): Payer: Self-pay | Admitting: General Surgery

## 2013-10-21 VITALS — BP 123/73 | HR 81 | Temp 98.6°F | Resp 16 | Ht 68.0 in | Wt 163.6 lb

## 2013-10-21 DIAGNOSIS — K4090 Unilateral inguinal hernia, without obstruction or gangrene, not specified as recurrent: Secondary | ICD-10-CM

## 2013-10-21 NOTE — Patient Instructions (Signed)
[facility] _______[facility]  INGUINAL HERNIA REPAIR: POST OP INSTRUCTIONS  Always review your discharge instruction sheet given to you by the facility where your surgery was performed. IF YOU HAVE DISABILITY OR FAMILY LEAVE FORMS, YOU MUST BRING THEM TO THE OFFICE FOR PROCESSING.   DO NOT GIVE THEM TO YOUR DOCTOR.  1. A  prescription for pain medication may be given to you upon discharge.  Take your pain medication as prescribed, if needed.  If narcotic pain medicine is not needed, then you may take acetaminophen (Tylenol) or ibuprofen (Advil) as needed. 2. Take your usually prescribed medications unless otherwise directed. 3. If you need a refill on your pain medication, please contact your pharmacy.  They will contact our office to request authorization. Prescriptions will not be filled after 5 pm or on week-ends. 4. You should follow a light diet the first 24 hours after arrival home, such as soup and crackers, etc.  Be sure to include lots of fluids daily.  Resume your normal diet the day after surgery. 5. Most patients will experience some swelling and bruising in the groin and scrotum.  Ice packs and reclining will help.  Swelling and bruising can take several days to resolve.  6. It is common to experience some constipation if taking pain medication after surgery.  Increasing fluid intake and taking a stool softener (such as Colace) will usually help or prevent this problem from occurring.  A mild laxative (Milk of Magnesia or Miralax) should be taken according to package directions if there are no bowel movements after 48 hours. 7. Unless discharge instructions indicate otherwise, you may remove your bandages 72 hours after surgery, and you may shower at that time.  You may have steri-strips (small skin tapes) in place directly over the incision.  These strips should be left on the skin for 7-10 days.  If your surgeon used skin glue on the incision, you may shower in 24 hours.  The  glue will flake off over the next 2-3 weeks.  Any sutures or staples will be removed at the office during your follow-up visit. 8. ACTIVITIES:  You may resume regular (light) daily activities beginning the next day-such as daily self-care, walking, climbing stairs-gradually increasing activities as tolerated.  You may have sexual intercourse when it is comfortable.  Refrain from any heavy lifting or straining until approved by your doctor. a. You may drive when you are no longer taking prescription pain medication, you can comfortably wear a seatbelt, and you can safely maneuver your car and apply brakes. b. RETURN TO WORK:  __________________________________________________________ 9. You should see your doctor in the office for a follow-up appointment approximately 2-3 weeks after your surgery.  Make sure that you call for this appointment within a day or two after you arrive home to insure a convenient appointment time. 10. OTHER INSTRUCTIONS:  __________________________________________________________________________________________________________________________________________________________________________________________  WHEN TO CALL YOUR DOCTOR: 1. Fever over 101.0 2. Inability to urinate 3. Nausea and/or vomiting 4. Extreme swelling or bruising 5. Continued bleeding from incision. 6. Increased pain, redness, or drainage from the incision  The clinic staff is available to answer your questions during regular business hours.  Please don't hesitate to call and ask to speak to one of the nurses for clinical concerns.  If you have a medical emergency, go to the nearest emergency room or call 911.  A surgeon from [facility] is always on call at the hospital   [address], [address],  [place], [place]  [address] ?  [address], [place], [place]   [address] [phone] ? [phone] ? FAX [phone] Web site: [email or web address]

## 2013-10-21 NOTE — Progress Notes (Signed)
Patient ID: Ricardo [name], male   DOB: 09/24/1956, 57 y.o.   MRN: [id number]  Chief Complaint  Patient presents with  . Hernia    HPI [name] is a 57 y.o. male.   HPI  He is referred by Dr. Robert Pennington for further evaluation and treatment of his symptomatic left inguinal hernia. He has had the hernia for about 2 years. It is becoming larger and sometimes uncomfortable. He has no obstructive symptoms. He does have some urinary difficulties post prostatectomy but takes Cialis for this and this seems to help him significantly.  No constipation. 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  Past Medical History  Diagnosis Date  . Left inguinal hernia   . Chronic kidney disease 09-18-11    kidney stones-no problem today  . Prostate cancer     prostate  . Carpal tunnel syndrome     bilateral numbness-worse while sleep  . Paroxysmal atrial fibrillation     Past Surgical History  Procedure Laterality Date  . Elbow surgery      bilateral ulnar nerve release  . Knee arthroscopy      left  . Colonoscopy  03/2006; 07/16/2011    2007: rectal adenoma 2012:Normal  . Prostate biopsy  05/04/2011    adenocarcinoma Gleason 6  . Robot assisted laparoscopic radical prostatectomy  09/24/2011    Procedure: ROBOTIC ASSISTED LAPAROSCOPIC RADICAL PROSTATECTOMY LEVEL 1;  Surgeon: Les Borden, MD;  Location: [facility];  Service: Urology;  Laterality: N/A;    Family History  Problem Relation Age of Onset  . Prostate cancer Father     Social History History  Substance Use Topics  . Smoking status: Former Smoker    Quit date: 09/17/2001  . Smokeless tobacco: Never Used  . Alcohol Use: 7.2 oz/week    5 Glasses of wine, 7 Cans of beer per week    No Known Allergies  Current Outpatient Prescriptions  Medication Sig Dispense Refill  . cholecalciferol (VITAMIN D) 1000 UNITS tablet Take 1,000 Units by mouth daily.       . cholecalciferol (VITAMIN D) 400 UNITS TABS Take by mouth. OTC       . CIALIS 20  MG tablet Take 1 tablet by mouth as needed. ED      . Cyanocobalamin (VITAMIN B 12 PO) Take 1 tablet by mouth 3 (three) times a week.        . Misc Natural Products (OSTEO BI-FLEX ADV DOUBLE ST PO) Take 1 tablet by mouth daily. Hold while in hospital      . MULTAQ 400 MG tablet Take 1 tablet by mouth Twice daily.      . Potassium 99 MG TABS Take 1 tablet by mouth 3 (three) times a week.         No current facility-administered medications for this visit.    Review of Systems Review of Systems  Constitutional: Negative.   HENT: Negative.   Respiratory: Negative.   Cardiovascular: Positive for palpitations.  Gastrointestinal: Negative.   Genitourinary: Positive for difficulty urinating (Improved with Cialis).  Musculoskeletal: Negative.   Neurological: Negative.   Hematological: Negative.     Blood pressure 123/73, pulse 81, temperature 98.6 F (37 C), temperature source Temporal, resp. rate 16, height 5' 8" (1.727 m), weight 163 lb 9.6 oz (74.208 kg).  Physical Exam Physical Exam  Constitutional: He appears well-developed and well-nourished. No distress.  HENT:  Head: Normocephalic and atraumatic.  Eyes: No scleral icterus.  Neck: Neck supple.    Cardiovascular: Normal rate.   Irregular rhythm.  Pulmonary/Chest: Effort normal and breath sounds normal.  Abdominal: He exhibits no mass. There is no tenderness.  Multiple small scars. No abdominal wall hernia.  Genitourinary:  Left inguinal bulge that is reducible. Right inguinal floor it is solid.  Musculoskeletal: He exhibits no edema.  Lymphadenopathy:    He has no cervical adenopathy.  Neurological: He is alert.  Skin: Skin is warm and dry.  Psychiatric: He has a normal mood and affect. His behavior is normal.    Data Reviewed Note from Dr. Pennington.  Assessment    1. Symptomatic left inguinal hernia. He cinched and repair.  2. Atrial fibrillation     Plan    Open left inguinal hernia repair with mesh after  obtaining cardiac clearance.  I have explained the procedure, risks, and aftercare of inguinal hernia repair.  Risks include but are not limited to bleeding, infection, wound problems, anesthesia, recurrence, bladder or intestine injury, urinary retention, testicular dysfunction, chronic pain, mesh problems.  He seems to understand and agrees to proceed.        Infinity Jeffords J 10/21/2013, 10:02 AM    

## 2013-10-21 NOTE — Telephone Encounter (Signed)
Nurse from Dr. Coralee Pesa office called to ask for more information about need for cardiac clearance.  All of her questions were answered and she stated she would try to get the clearance over this afternoon.

## 2013-10-21 NOTE — Telephone Encounter (Signed)
Left msg on nurses voicemail to call our office back. We need cardiac clearance on patient. Scheduling for [facility] repair. Please have Dr Sandy Salaam fax note of clearance to our office. Arrange appointment if necessary.

## 2013-10-22 ENCOUNTER — Telehealth (INDEPENDENT_AMBULATORY_CARE_PROVIDER_SITE_OTHER): Payer: Self-pay

## 2013-10-22 ENCOUNTER — Encounter (INDEPENDENT_AMBULATORY_CARE_PROVIDER_SITE_OTHER): Payer: Self-pay

## 2013-10-22 NOTE — Telephone Encounter (Signed)
duplicate

## 2013-10-22 NOTE — Telephone Encounter (Signed)
Called Ricardo [name] back and asked her to fax over letter so we can get it scanned in chart.

## 2013-10-22 NOTE — Telephone Encounter (Signed)
Message copied by Brennan Bailey on Thu Oct 22, 2013 11:18 AM ------      Message from: Rise Paganini      Created: Thu Oct 22, 2013 10:59 AM      Regarding: Quillian Quince with [employer] Stated that this patient has been cleared. Please call her @ [phone] if you have any questions. Thank you. ------

## 2013-11-03 ENCOUNTER — Encounter (HOSPITAL_COMMUNITY)
Admission: RE | Admit: 2013-11-03 | Discharge: 2013-11-03 | Disposition: A | Discharge status: Home / Self Care | Payer: 59 | Source: Ambulatory Visit | Attending: General Surgery | Admitting: General Surgery

## 2013-11-03 ENCOUNTER — Encounter (HOSPITAL_COMMUNITY): Payer: Self-pay

## 2013-11-03 DIAGNOSIS — G5600 Carpal tunnel syndrome, unspecified upper limb: Secondary | ICD-10-CM

## 2013-11-03 HISTORY — DX: Personal history of urinary calculi: Z87.442

## 2013-11-03 HISTORY — DX: Carpal tunnel syndrome, unspecified upper limb: G56.00

## 2013-11-03 LAB — COMPREHENSIVE METABOLIC PANEL
BUN: 16 mg/dL (ref 6–23)
CO2: 25 mEq/L (ref 19–32)
Chloride: 100 mEq/L (ref 96–112)
Creatinine, Ser: 1.04 mg/dL (ref 0.50–1.35)
GFR calc Af Amer: 90 mL/min — ABNORMAL LOW (ref >90)
GFR calc non Af Amer: 77 mL/min — ABNORMAL LOW (ref >90)
Sodium: 135 mEq/L (ref 135–145)
Total Bilirubin: 1.4 mg/dL — ABNORMAL HIGH (ref 0.3–1.2)
Total Protein: 6.8 g/dL (ref 6.0–8.3)

## 2013-11-03 LAB — CBC WITH DIFFERENTIAL/PLATELET
Basophils Absolute: 0 10*3/uL (ref 0.0–0.1)
Basophils Relative: 1 % (ref 0–1)
Eosinophils Relative: 4 % (ref 0–5)
HCT: 43.8 % (ref 39.0–52.0)
Lymphocytes Relative: 43 % (ref 12–46)
MCH: 31.6 pg (ref 26.0–34.0)
Monocytes Absolute: 0.5 10*3/uL (ref 0.1–1.0)
Neutro Abs: 1.8 10*3/uL (ref 1.7–7.7)
Neutrophils Relative %: 42 % — ABNORMAL LOW (ref 43–77)
Platelets: 143 10*3/uL — ABNORMAL LOW (ref 150–400)
RDW: 12.5 % (ref 11.5–15.5)
WBC: 4.4 10*3/uL (ref 4.0–10.5)

## 2013-11-03 LAB — PROTIME-INR
INR: 0.99 (ref 0.00–1.49)
Prothrombin Time: 12.9 seconds (ref 11.6–15.2)

## 2013-11-03 NOTE — Pre-Procedure Instructions (Signed)
11-03-13 EKG 9'14,5'14/ CXR 5'14 -reports with chart.

## 2013-11-03 NOTE — Patient Instructions (Addendum)
20 Ricardo [name]  11/03/2013   Your procedure is scheduled on: 12-19  -2014  Report to [facility] at     0730   AM.  Call this number if you have problems the morning of surgery: [phone]  Or Presurgical Testing [phone]([employer])   Remember: Follow any bowel prep instructions per MD office.    Do not eat food:After Midnight.   Take these medicines the morning of surgery with A SIP OF WATER: Multaq.   Do not wear jewelry, make-up or nail polish.  Do not wear lotions, powders, or perfumes. You may wear deodorant.  Do not shave 12 hours prior to first CHG shower(legs and under arms).(face and neck okay.)  Do not bring valuables to the hospital.  Contacts, dentures or removable bridgework, body piercing, hair pins may not be worn into surgery.  Leave suitcase in the car. After surgery it may be brought to your room.  For patients admitted to the hospital, checkout time is 11:00 AM the day of discharge.   Patients discharged the day of surgery will not be allowed to drive home. Must have responsible person with you x 24 hours once discharged.  Name and phone number of your driver: [name],[name] cell  Special Instructions: CHG(Chlorhedine 4%-"Hibiclens","Betasept","Aplicare") Shower Use Special Wash: see special instructions.(avoid face and genitals)   Remember : Type/Screen "Blue armbands" - may not be removed once applied(would result in being retested if removed).  Failure to follow these instructions may result in Cancellation of your surgery.   Patient signature_______________________________________________________

## 2013-11-06 ENCOUNTER — Ambulatory Visit (HOSPITAL_COMMUNITY): Payer: 59 | Admitting: Registered Nurse

## 2013-11-06 ENCOUNTER — Encounter (HOSPITAL_COMMUNITY): Payer: Self-pay | Admitting: *Deleted

## 2013-11-06 ENCOUNTER — Encounter (HOSPITAL_COMMUNITY)
Admission: RE | Disposition: A | Discharge status: Home / Self Care | Payer: Self-pay | Source: Ambulatory Visit | Attending: General Surgery

## 2013-11-06 ENCOUNTER — Encounter (HOSPITAL_COMMUNITY): Payer: 59 | Admitting: Registered Nurse

## 2013-11-06 ENCOUNTER — Ambulatory Visit (HOSPITAL_COMMUNITY)
Admission: RE | Admit: 2013-11-06 | Discharge: 2013-11-06 | Disposition: A | Discharge status: Home / Self Care | Payer: 59 | Source: Ambulatory Visit | Attending: General Surgery | Admitting: General Surgery

## 2013-11-06 DIAGNOSIS — K4090 Unilateral inguinal hernia, without obstruction or gangrene, not specified as recurrent: Secondary | ICD-10-CM

## 2013-11-06 DIAGNOSIS — Z79899 Other long term (current) drug therapy: Secondary | ICD-10-CM | POA: Insufficient documentation

## 2013-11-06 DIAGNOSIS — I4891 Unspecified atrial fibrillation: Secondary | ICD-10-CM | POA: Insufficient documentation

## 2013-11-06 DIAGNOSIS — C61 Malignant neoplasm of prostate: Secondary | ICD-10-CM | POA: Insufficient documentation

## 2013-11-06 DIAGNOSIS — Z87891 Personal history of nicotine dependence: Secondary | ICD-10-CM | POA: Insufficient documentation

## 2013-11-06 HISTORY — PX: INGUINAL HERNIA REPAIR: SHX194

## 2013-11-06 HISTORY — PX: INSERTION OF MESH: SHX5868

## 2013-11-06 HISTORY — PX: HERNIA REPAIR: SHX51

## 2013-11-06 SURGERY — REPAIR, HERNIA, INGUINAL, ADULT
Anesthesia: General | Laterality: Left

## 2013-11-06 MED ORDER — LIDOCAINE HCL (CARDIAC) 20 MG/ML IV SOLN
INTRAVENOUS | Status: AC
  Filled 2013-11-06: qty 5

## 2013-11-06 MED ORDER — FENTANYL CITRATE 0.05 MG/ML IJ SOLN
INTRAMUSCULAR | Status: AC
  Filled 2013-11-06: qty 5

## 2013-11-06 MED ORDER — DEXAMETHASONE SODIUM PHOSPHATE 10 MG/ML IJ SOLN
INTRAMUSCULAR | Status: DC | PRN
  Administered 2013-11-06: 10 mg via INTRAVENOUS

## 2013-11-06 MED ORDER — CEFAZOLIN SODIUM-DEXTROSE 2-3 GM-% IV SOLR
2.0000 g | INTRAVENOUS | Status: AC
  Administered 2013-11-06: 2 g via INTRAVENOUS

## 2013-11-06 MED ORDER — MIDAZOLAM HCL 5 MG/5ML IJ SOLN
INTRAMUSCULAR | Status: DC | PRN
  Administered 2013-11-06: 2 mg via INTRAVENOUS

## 2013-11-06 MED ORDER — BUPIVACAINE-EPINEPHRINE 0.5% -1:200000 IJ SOLN
INTRAMUSCULAR | Status: DC | PRN
  Administered 2013-11-06: 18 mL

## 2013-11-06 MED ORDER — PROPOFOL 10 MG/ML IV BOLUS
INTRAVENOUS | Status: AC
  Filled 2013-11-06: qty 20

## 2013-11-06 MED ORDER — MIDAZOLAM HCL 2 MG/2ML IJ SOLN
INTRAMUSCULAR | Status: AC
  Filled 2013-11-06: qty 2

## 2013-11-06 MED ORDER — MORPHINE SULFATE 10 MG/ML IJ SOLN: 2.0000 mg | INTRAMUSCULAR | Status: DC | PRN

## 2013-11-06 MED ORDER — OXYCODONE HCL 5 MG PO TABS
5.0000 mg | ORAL_TABLET | ORAL | Status: DC | PRN
Start: 2013-11-06 — End: 2013-11-06
  Administered 2013-11-06: 5 mg via ORAL
  Filled 2013-11-06: qty 1

## 2013-11-06 MED ORDER — ACETAMINOPHEN 325 MG PO TABS: 650.0000 mg | ORAL_TABLET | ORAL | Status: DC | PRN

## 2013-11-06 MED ORDER — HYDROMORPHONE HCL PF 1 MG/ML IJ SOLN: 0.2500 mg | INTRAMUSCULAR | Status: DC | PRN

## 2013-11-06 MED ORDER — MEPERIDINE HCL 50 MG/ML IJ SOLN: 6.2500 mg | INTRAMUSCULAR | Status: DC | PRN

## 2013-11-06 MED ORDER — PROMETHAZINE HCL 25 MG/ML IJ SOLN: 6.2500 mg | INTRAMUSCULAR | Status: DC | PRN

## 2013-11-06 MED ORDER — ONDANSETRON HCL 4 MG/2ML IJ SOLN
INTRAMUSCULAR | Status: AC
  Filled 2013-11-06: qty 2

## 2013-11-06 MED ORDER — FENTANYL CITRATE 0.05 MG/ML IJ SOLN
INTRAMUSCULAR | Status: DC | PRN
  Administered 2013-11-06: 100 ug via INTRAVENOUS
  Administered 2013-11-06: 50 ug via INTRAVENOUS

## 2013-11-06 MED ORDER — LACTATED RINGERS IV SOLN
INTRAVENOUS | Status: DC
Start: 2013-11-06 — End: 2013-11-06
  Administered 2013-11-06: 1000 mL via INTRAVENOUS

## 2013-11-06 MED ORDER — BUPIVACAINE LIPOSOME 1.3 % IJ SUSP
20.0000 mL | Freq: Once | INTRAMUSCULAR | Status: AC
  Administered 2013-11-06: 20 mL
  Filled 2013-11-06: qty 20

## 2013-11-06 MED ORDER — NEOSTIGMINE METHYLSULFATE 1 MG/ML IJ SOLN
INTRAMUSCULAR | Status: DC | PRN
  Administered 2013-11-06: 4 mg via INTRAVENOUS

## 2013-11-06 MED ORDER — ROCURONIUM BROMIDE 100 MG/10ML IV SOLN
INTRAVENOUS | Status: DC | PRN
  Administered 2013-11-06: 50 mg via INTRAVENOUS

## 2013-11-06 MED ORDER — OXYCODONE HCL 5 MG PO TABS: 5.0000 mg | ORAL_TABLET | Freq: Once | ORAL | Status: DC | PRN

## 2013-11-06 MED ORDER — OXYCODONE HCL 5 MG/5ML PO SOLN
5.0000 mg | Freq: Once | ORAL | Status: DC | PRN
  Filled 2013-11-06: qty 5

## 2013-11-06 MED ORDER — SODIUM CHLORIDE 0.9 % IJ SOLN: 3.0000 mL | INTRAMUSCULAR | Status: DC | PRN

## 2013-11-06 MED ORDER — ONDANSETRON HCL 4 MG/2ML IJ SOLN: 4.0000 mg | Freq: Four times a day (QID) | INTRAMUSCULAR | Status: DC | PRN

## 2013-11-06 MED ORDER — GLYCOPYRROLATE 0.2 MG/ML IJ SOLN
INTRAMUSCULAR | Status: DC | PRN
  Administered 2013-11-06: .4 mg via INTRAVENOUS

## 2013-11-06 MED ORDER — LIDOCAINE HCL (CARDIAC) 20 MG/ML IV SOLN
INTRAVENOUS | Status: DC | PRN
  Administered 2013-11-06: 80 mg via INTRAVENOUS

## 2013-11-06 MED ORDER — ACETAMINOPHEN 650 MG RE SUPP
650.0000 mg | RECTAL | Status: DC | PRN
  Filled 2013-11-06: qty 1

## 2013-11-06 MED ORDER — CEFAZOLIN SODIUM-DEXTROSE 2-3 GM-% IV SOLR
INTRAVENOUS | Status: AC
  Filled 2013-11-06: qty 50

## 2013-11-06 MED ORDER — GLYCOPYRROLATE 0.2 MG/ML IJ SOLN
INTRAMUSCULAR | Status: AC
  Filled 2013-11-06: qty 3

## 2013-11-06 MED ORDER — PROPOFOL 10 MG/ML IV BOLUS
INTRAVENOUS | Status: DC | PRN
  Administered 2013-11-06: 180 mg via INTRAVENOUS

## 2013-11-06 MED ORDER — ONDANSETRON HCL 4 MG/2ML IJ SOLN
INTRAMUSCULAR | Status: DC | PRN
  Administered 2013-11-06: 4 mg via INTRAVENOUS

## 2013-11-06 MED ORDER — 0.9 % SODIUM CHLORIDE (POUR BTL) OPTIME
TOPICAL | Status: DC | PRN
  Administered 2013-11-06: 1000 mL

## 2013-11-06 MED ORDER — BUPIVACAINE-EPINEPHRINE PF 0.5-1:200000 % IJ SOLN
INTRAMUSCULAR | Status: AC
  Filled 2013-11-06: qty 30

## 2013-11-06 MED ORDER — OXYCODONE HCL 5 MG PO TABS: 5.0000 mg | ORAL_TABLET | ORAL | Status: DC | PRN

## 2013-11-06 SURGICAL SUPPLY — 40 items
APL SKNCLS STERI-STRIP NONHPOA (GAUZE/BANDAGES/DRESSINGS) ×1
BENZOIN TINCTURE PRP APPL 2/3 (GAUZE/BANDAGES/DRESSINGS) ×2 IMPLANT
BLADE HEX COATED 2.75 (ELECTRODE) ×2 IMPLANT
BLADE SURG 15 STRL LF DISP TIS (BLADE) ×1 IMPLANT
BLADE SURG 15 STRL SS (BLADE) ×2
CANISTER SUCTION 2500CC (MISCELLANEOUS) ×2 IMPLANT
CATH KIT ON Q 2.5IN SLV (PAIN MANAGEMENT) IMPLANT
DECANTER SPIKE VIAL GLASS SM (MISCELLANEOUS) ×2 IMPLANT
DRAIN PENROSE 18X1/2 LTX STRL (DRAIN) ×2 IMPLANT
DRAPE INCISE IOBAN 66X45 STRL (DRAPES) IMPLANT
DRAPE LAPAROTOMY TRNSV 102X78 (DRAPE) ×2 IMPLANT
DRSG TEGADERM 4X4.75 (GAUZE/BANDAGES/DRESSINGS) ×1 IMPLANT
ELECT REM PT RETURN 9FT ADLT (ELECTROSURGICAL) ×2
ELECTRODE REM PT RTRN 9FT ADLT (ELECTROSURGICAL) ×1 IMPLANT
GLOVE ECLIPSE 8.0 STRL XLNG CF (GLOVE) ×2 IMPLANT
GLOVE INDICATOR 8.0 STRL GRN (GLOVE) ×2 IMPLANT
GOWN STRL REIN XL XLG (GOWN DISPOSABLE) ×4 IMPLANT
KIT BASIN OR (CUSTOM PROCEDURE TRAY) ×2 IMPLANT
MESH HERNIA 3X6 (Mesh General) ×1 IMPLANT
NDL HYPO 25X1 1.5 SAFETY (NEEDLE) ×1 IMPLANT
NEEDLE HYPO 25X1 1.5 SAFETY (NEEDLE) ×2 IMPLANT
NS IRRIG 1000ML POUR BTL (IV SOLUTION) ×2 IMPLANT
PACK BASIC VI WITH GOWN DISP (CUSTOM PROCEDURE TRAY) ×2 IMPLANT
PAD TELFA 2X3 NADH STRL (GAUZE/BANDAGES/DRESSINGS) ×1 IMPLANT
PENCIL BUTTON HOLSTER BLD 10FT (ELECTRODE) ×2 IMPLANT
SPONGE GAUZE 4X4 12PLY (GAUZE/BANDAGES/DRESSINGS) IMPLANT
SPONGE LAP 4X18 X RAY DECT (DISPOSABLE) ×2 IMPLANT
STRIP CLOSURE SKIN 1/2X4 (GAUZE/BANDAGES/DRESSINGS) ×2 IMPLANT
SUT MNCRL AB 4-0 PS2 18 (SUTURE) ×2 IMPLANT
SUT PROLENE 2 0 CT2 30 (SUTURE) ×4 IMPLANT
SUT VIC AB 2-0 SH 18 (SUTURE) ×2 IMPLANT
SUT VIC AB 3-0 54XBRD REEL (SUTURE) ×1 IMPLANT
SUT VIC AB 3-0 BRD 54 (SUTURE) ×2
SUT VIC AB 3-0 SH 27 (SUTURE) ×4
SUT VIC AB 3-0 SH 27XBRD (SUTURE) ×1 IMPLANT
SYR 20CC LL (SYRINGE) ×2 IMPLANT
SYR BULB IRRIGATION 50ML (SYRINGE) ×2 IMPLANT
TOWEL OR 17X26 10 PK STRL BLUE (TOWEL DISPOSABLE) ×2 IMPLANT
TOWEL OR NON WOVEN STRL DISP B (DISPOSABLE) ×2 IMPLANT
YANKAUER SUCT BULB TIP 10FT TU (MISCELLANEOUS) ×2 IMPLANT

## 2013-11-06 NOTE — Transfer of Care (Signed)
Immediate Anesthesia Transfer of Care Note  Patient: Ricardo [name]  Procedure(s) Performed: Procedure(s): HERNIA REPAIR INGUINAL ADULT (Left) INSERTION OF MESH (Left)  Patient Location: PACU  Anesthesia Type:General  Level of Consciousness: awake, alert , oriented and patient cooperative  Airway & Oxygen Therapy: Patient Spontanous Breathing and Patient connected to face mask oxygen  Post-op Assessment: Report given to PACU RN, Post -op Vital signs reviewed and stable and Patient moving all extremities  Post vital signs: Reviewed and stable  Complications: No apparent anesthesia complications

## 2013-11-06 NOTE — Anesthesia Postprocedure Evaluation (Signed)
Anesthesia Post Note  Patient: Ricardo [name]  Procedure(s) Performed: Procedure(s) (LRB): HERNIA REPAIR INGUINAL ADULT (Left) INSERTION OF MESH (Left)  Anesthesia type: General  Patient location: PACU  Post pain: Pain level controlled  Post assessment: Post-op Vital signs reviewed  Last Vitals: BP 132/85  Pulse 71  Temp(Src) 36.3 C (Oral)  Resp 16  SpO2 96%  Post vital signs: Reviewed  Level of consciousness: sedated  Complications: No apparent anesthesia complications

## 2013-11-06 NOTE — Preoperative (Signed)
Beta Blockers   Reason not to administer Beta Blockers:Not Applicable 

## 2013-11-06 NOTE — Op Note (Signed)
Preoperative diagnosis:  Left inguinal hernia.  Postoperative diagnosis:  Same  Procedure:  Left inguinal hernia repair with mesh.  Surgeon:  Avel Peace, M.D.  Anesthesia:  General with local (Marcaine and Exparel).  Indication:  This is a 57 year old male with a symptomatic left inguinal hernia.  He presents for repair.  Technique:  He was seen in the holding room and the left groin was marked with my initials. He was brought to the operating, placed supine on the operating table, and the anesthetic was administered by the anesthesiologist. The hair in the groin area was clipped as was felt to be necessary. This area was then sterilely prepped and draped.  Local anesthetic was infiltrated in the superficial and deep tissues in the left groin.  An incision was made through the skin and subcutaneous tissue until the external oblique aponeurosis was identified.  Local anesthetic was infiltrated deep to the external oblique aponeurosis. The external oblique aponeurosis was divided through the external ring medially and back toward the anterior superior iliac spine laterally. Using blunt dissection, the shelving edge of the inguinal ligament was identified inferiorly and the internal oblique aponeurosis and muscle were identified superiorly. The ilioinguinal nerve was identified and preserved.  The spermatic cord was isolated and a posterior window was made around it. An indirect hernia sac was identified and separated from the spermatic cord using blunt dissection. The hernia sac and its contents were reduced through the indirect hernia defect.   A piece of 3" x 6" polypropylene mesh was brought into the field and anchored 1-2 cm medial to the pubic tubercle with 2-0 Prolene suture. The inferior aspect of the mesh was anchored to the shelving edge of the inguinal ligament with running 2-0 Prolene suture to a level 1-2 cm lateral to the internal ring. A slit was cut in the mesh creating 2 tails.  These were wrapped around the spermatic cord. The superior aspect of the mesh was anchored to the internal oblique aponeurosis and muscle with interrupted 2-0 Vicryl sutures. The 2 tails of the mesh were then crossed creating a new internal ring and were anchored to the shelving edge of the inguinal ligament with 2-0 Prolene suture. The tip of a hemostat could be placed through the new aperture. The lateral aspect of the mesh was then tucked deep to the external oblique aponeurosis.  The wound was inspected and hemostasis was adequate.  Exparel was injected into the wound. The external oblique aponeurosis was then closed over the mesh and cord with running 3-0 Vicryl suture. The subcutaneous tissue was closed with running 3-0 Vicryl suture. The skin closed with a running 4-0 Monocryl subcuticular stitch.  Steri-Strips and a sterile dressing were applied.  The procedure was well-tolerated without any apparent complications and the patient was taken to the recovery room in satisfactory condition.

## 2013-11-06 NOTE — H&P (View-Only) (Signed)
Patient ID: Ricardo [name], male   DOB: May 29, 1956, 57 y.o.   MRN: [id number]  Chief Complaint  Patient presents with  . Hernia    HPI Ricardo [name] is a 57 y.o. male.   HPI  He is referred by Dr. Einar Grad for further evaluation and treatment of his symptomatic left inguinal hernia. He has had the hernia for about 2 years. It is becoming larger and sometimes uncomfortable. He has no obstructive symptoms. He does have some urinary difficulties post prostatectomy but takes Cialis for this and this seems to help him significantly.  No constipation. 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  Past Medical History  Diagnosis Date  . Left inguinal hernia   . Chronic kidney disease 09-18-11    kidney stones-no problem today  . Prostate cancer     prostate  . Carpal tunnel syndrome     bilateral numbness-worse while sleep  . Paroxysmal atrial fibrillation     Past Surgical History  Procedure Laterality Date  . Elbow surgery      bilateral ulnar nerve release  . Knee arthroscopy      left  . Colonoscopy  03/2006; 07/16/2011    2007: rectal adenoma 2012:Normal  . Prostate biopsy  05/04/2011    adenocarcinoma Gleason 6  . Robot assisted laparoscopic radical prostatectomy  09/24/2011    Procedure: ROBOTIC ASSISTED LAPAROSCOPIC RADICAL PROSTATECTOMY LEVEL 1;  Surgeon: Crecencio Mc, MD;  Location: [facility];  Service: Urology;  Laterality: N/A;    Family History  Problem Relation Age of Onset  . Prostate cancer Father     Social History History  Substance Use Topics  . Smoking status: Former Smoker    Quit date: 09/17/2001  . Smokeless tobacco: Never Used  . Alcohol Use: 7.2 oz/week    5 Glasses of wine, 7 Cans of beer per week    No Known Allergies  Current Outpatient Prescriptions  Medication Sig Dispense Refill  . cholecalciferol (VITAMIN D) 1000 UNITS tablet Take 1,000 Units by mouth daily.       . cholecalciferol (VITAMIN D) 400 UNITS TABS Take by mouth. OTC       . CIALIS 20  MG tablet Take 1 tablet by mouth as needed. ED      . Cyanocobalamin (VITAMIN B 12 PO) Take 1 tablet by mouth 3 (three) times a week.        . Misc Natural Products (OSTEO BI-FLEX ADV DOUBLE ST PO) Take 1 tablet by mouth daily. Hold while in hospital      . MULTAQ 400 MG tablet Take 1 tablet by mouth Twice daily.      . Potassium 99 MG TABS Take 1 tablet by mouth 3 (three) times a week.         No current facility-administered medications for this visit.    Review of Systems Review of Systems  Constitutional: Negative.   HENT: Negative.   Respiratory: Negative.   Cardiovascular: Positive for palpitations.  Gastrointestinal: Negative.   Genitourinary: Positive for difficulty urinating (Improved with Cialis).  Musculoskeletal: Negative.   Neurological: Negative.   Hematological: Negative.     Blood pressure 123/73, pulse 81, temperature 98.6 F (37 C), temperature source Temporal, resp. rate 16, height 5\' 8"  (1.727 m), weight 163 lb 9.6 oz (74.208 kg).  Physical Exam Physical Exam  Constitutional: He appears well-developed and well-nourished. No distress.  HENT:  Head: Normocephalic and atraumatic.  Eyes: No scleral icterus.  Neck: Neck supple.  Cardiovascular: Normal rate.   Irregular rhythm.  Pulmonary/Chest: Effort normal and breath sounds normal.  Abdominal: He exhibits no mass. There is no tenderness.  Multiple small scars. No abdominal wall hernia.  Genitourinary:  Left inguinal bulge that is reducible. Right inguinal floor it is solid.  Musculoskeletal: He exhibits no edema.  Lymphadenopathy:    He has no cervical adenopathy.  Neurological: He is alert.  Skin: Skin is warm and dry.  Psychiatric: He has a normal mood and affect. His behavior is normal.    Data Reviewed Note from Dr. Buford Dresser.  Assessment    1. Symptomatic left inguinal hernia. He cinched and repair.  2. Atrial fibrillation     Plan    Open left inguinal hernia repair with mesh after  obtaining cardiac clearance.  I have explained the procedure, risks, and aftercare of inguinal hernia repair.  Risks include but are not limited to bleeding, infection, wound problems, anesthesia, recurrence, bladder or intestine injury, urinary retention, testicular dysfunction, chronic pain, mesh problems.  He seems to understand and agrees to proceed.        Aelyn Stanaland J 10/21/2013, 10:02 AM

## 2013-11-06 NOTE — Progress Notes (Signed)
Post op Phase 2 in short stay. Immediately upon arrival to Short Stay pt was able to ambulate to BR to void moderate amt clear yellow urine and tolerated this well.

## 2013-11-06 NOTE — Interval H&P Note (Signed)
History and Physical Interval Note:  11/06/2013 9:40 AM  Ricardo [name]  has presented today for surgery, with the diagnosis of LEFT INGUINAL HERNIA   The various methods of treatment have been discussed with the patient and family. After consideration of risks, benefits and other options for treatment, the patient has consented to  Procedure(s): HERNIA REPAIR INGUINAL ADULT (Left) INSERTION OF MESH (Left) as a surgical intervention .  The patient's history has been reviewed, patient examined, no change in status, stable for surgery.  I have reviewed the patient's chart and labs.  Questions were answered to the patient's satisfaction.     [name] Shela Commons

## 2013-11-06 NOTE — Anesthesia Preprocedure Evaluation (Addendum)
Anesthesia Evaluation  Patient identified by MRN, date of birth, ID band Patient awake    Reviewed: Allergy & Precautions, H&P , NPO status , Patient's Chart, lab work & pertinent test results  History of Anesthesia Complications (+) PONV  Airway Mallampati: I TM Distance: >3 FB Neck ROM: Full    Dental  (+) Teeth Intact, Caps and Dental Advisory Given   Pulmonary neg pulmonary ROS, former smoker,    Pulmonary exam normal       Cardiovascular + dysrhythmias Atrial Fibrillation Rhythm:Regular Rate:Normal  A Fib   Neuro/Psych negative neurological ROS  negative psych ROS   GI/Hepatic negative GI ROS, Neg liver ROS,   Endo/Other  negative endocrine ROS  Renal/GU negative Renal ROS     Musculoskeletal negative musculoskeletal ROS (+)   Abdominal   Peds  Hematology negative hematology ROS (+)   Anesthesia Other Findings Caps in back  Reproductive/Obstetrics                       Anesthesia Physical  Anesthesia Plan  ASA: II  Anesthesia Plan: General   Post-op Pain Management:    Induction: Intravenous  Airway Management Planned: Oral ETT  Additional Equipment:   Intra-op Plan:   Post-operative Plan: Extubation in OR  Informed Consent: I have reviewed the patients History and Physical, chart, labs and discussed the procedure including the risks, benefits and alternatives for the proposed anesthesia with the patient or authorized representative who has indicated his/her understanding and acceptance.   Dental advisory given  Plan Discussed with: CRNA and Surgeon  Anesthesia Plan Comments:        Anesthesia Quick Evaluation

## 2013-11-09 ENCOUNTER — Encounter (HOSPITAL_COMMUNITY): Payer: Self-pay | Admitting: General Surgery

## 2013-11-10 ENCOUNTER — Telehealth (INDEPENDENT_AMBULATORY_CARE_PROVIDER_SITE_OTHER): Payer: Self-pay | Admitting: *Deleted

## 2013-11-10 NOTE — Telephone Encounter (Signed)
Patient's father called to ask for a refill of pain medication.  He states that patient only has enough to get through noon today.  Father reports that patient's pain is 10/10 when not using the pain medication.  Father denies that patient is having a fever, drainage, redness, or signs of infection at this time.  Father does state that patient has not had a bowel movement since surgery.  Encouraged father to have patient take a dose of Miralax to help with a BM per protocol.  Explained that I will speak with the urgent office MD today at 230p to see about getting a prescription refill of pain medication then we will call him back to let him know.  Mr. Zimbelman states understanding and agreeable with POC at this time.

## 2013-11-25 ENCOUNTER — Ambulatory Visit (INDEPENDENT_AMBULATORY_CARE_PROVIDER_SITE_OTHER): Payer: 59 | Admitting: General Surgery

## 2013-11-25 ENCOUNTER — Encounter (INDEPENDENT_AMBULATORY_CARE_PROVIDER_SITE_OTHER): Payer: Self-pay | Admitting: General Surgery

## 2013-11-25 VITALS — BP 112/76 | HR 72 | Temp 97.2°F | Resp 16 | Ht 68.0 in | Wt 158.8 lb

## 2013-11-25 DIAGNOSIS — Z4889 Encounter for other specified surgical aftercare: Secondary | ICD-10-CM

## 2013-11-25 NOTE — Progress Notes (Signed)
He is here for his first postoperative visit after a left inguinal hernia repair of mesh. He was very sore his first 3 days but this has improved.  On physical exam he looks well and is in no acute distress.  Left groin-incision is clean and intact with minimal swelling. No ecchymosis.  Impression: Status post left inguinal hernia repair with mesh-Doing well from this.  Plan:  Continue light activities. 6 weeks after the date of surgery resume normal activities as tolerated including work. If he feels he still a little bit too sore to go back to work, I've asked him to call as the week before so we can extend his leave.

## 2013-11-25 NOTE — Patient Instructions (Signed)
May resume normal activities as tolerated 6 weeks after the date of surgery.

## 2013-12-14 ENCOUNTER — Encounter (INDEPENDENT_AMBULATORY_CARE_PROVIDER_SITE_OTHER): Payer: Self-pay

## 2015-10-10 ENCOUNTER — Ambulatory Visit: Payer: Self-pay

## 2015-10-10 ENCOUNTER — Encounter: Payer: 59 | Admitting: Podiatry

## 2015-12-09 ENCOUNTER — Encounter: Payer: Self-pay | Admitting: Internal Medicine

## 2015-12-15 ENCOUNTER — Ambulatory Visit (INDEPENDENT_AMBULATORY_CARE_PROVIDER_SITE_OTHER): Payer: 59 | Admitting: Physician Assistant

## 2015-12-15 ENCOUNTER — Encounter: Payer: Self-pay | Admitting: Physician Assistant

## 2015-12-15 VITALS — BP 122/72 | HR 84 | Ht 67.5 in | Wt 152.0 lb

## 2015-12-15 DIAGNOSIS — K648 Other hemorrhoids: Secondary | ICD-10-CM

## 2015-12-15 DIAGNOSIS — Z86010 Personal history of colonic polyps: Secondary | ICD-10-CM | POA: Diagnosis not present

## 2015-12-15 DIAGNOSIS — K589 Irritable bowel syndrome without diarrhea: Secondary | ICD-10-CM | POA: Diagnosis not present

## 2015-12-15 MED ORDER — HYDROCORTISONE ACETATE 25 MG RE SUPP: 25.0000 mg | Freq: Two times a day (BID) | RECTAL | Status: DC

## 2015-12-15 NOTE — Progress Notes (Signed)
Patient ID: Ricardo [name], male   DOB: 1956/05/09, 60 y.o.   MRN: [id number]    HPI:  Ricardo [name] is a 60 y.o.   male who is known to Dr. Carlean Purl from prior colonoscopy. He had a colonoscopy in 2007 interim at which time a 6 mm rectal adenoma was removed. He had a surveillance colonoscopy on 07/16/2011 by Dr. Carlean Purl which was a normal colonoscopy with an excellent prep. He was advised to have surveillance in 5 years.  History nor his presents today with what he feels is a change in bowel habits. He states he used to move his bowels once daily, but for the past 3-4 months has 4-5 small bowel movements daily. His bowel movements are broken into pieces and are sometimes currently. He occasionally has some rectal cramping with defecation but does not feel his if he is passing groundglass. He often has a sensation of incomplete evacuation and has to wipe a lot after bowel movements. He occasionally has some anal seepage. He occasionally has tenesmus but has not had any blood or mucus with his stools he has no abdominal pain. His appetite as been good and his weight is been stable. The of prostate cancer diagnosed in 2012 for which she had a radical prostatectomy. He has not had radiation or chemotherapy. He has not had any bright red blood per rectum or melena in his stools are not oily.   Past Medical History  Diagnosis Date  . Left inguinal hernia   . Prostate cancer [facility])     prostate  . Paroxysmal atrial fibrillation (HCC)   . History of kidney stones     '12 x 1 episode  . Carpal tunnel syndrome 11-03-13    bilateral numbness-worse while sleep-remains an issue    Past Surgical History  Procedure Laterality Date  . Elbow surgery      bilateral ulnar nerve release  . Knee arthroscopy      left  . Colonoscopy  03/2006; 07/16/2011    2007: rectal adenoma 2012:Normal  . Prostate biopsy  05/04/2011    adenocarcinoma Gleason 6  . Robot assisted laparoscopic radical prostatectomy  09/24/2011      Procedure: ROBOTIC ASSISTED LAPAROSCOPIC RADICAL PROSTATECTOMY LEVEL 1;  Surgeon: Dutch Gray, MD;  Location: [facility];  Service: Urology;  Laterality: N/A;  . Inguinal hernia repair Left 11/06/2013    Procedure: HERNIA REPAIR INGUINAL ADULT;  Surgeon: Odis Hollingshead, MD;  Location: [facility];  Service: General;  Laterality: Left;  . Insertion of mesh Left 11/06/2013    Procedure: INSERTION OF MESH;  Surgeon: Odis Hollingshead, MD;  Location: [facility];  Service: General;  Laterality: Left;  . Hernia repair Left 11/06/13    [facility]   Family History  Problem Relation Age of Onset  . Prostate cancer Father    Social History  Substance Use Topics  . Smoking status: Former Smoker    Quit date: 09/17/2001  . Smokeless tobacco: Never Used  . Alcohol Use: 7.2 oz/week    5 Glasses of wine, 7 Cans of beer per week     Comment: daily   Current Outpatient Prescriptions  Medication Sig Dispense Refill  . aspirin 81 MG tablet Take 81 mg by mouth daily.    . cholecalciferol (VITAMIN D) 1000 UNITS tablet Take 1,000 Units by mouth daily.     . Cyanocobalamin (VITAMIN B 12 PO) Take 1 tablet by mouth 3 (three) times a week.      Marland Kitchen  Misc Natural Products (OSTEO BI-FLEX ADV DOUBLE ST PO) Take 1 tablet by mouth daily. Hold while in hospital    . MULTAQ 400 MG tablet Take 1 tablet by mouth Twice daily.    . Multiple Vitamin (MULTIVITAMIN WITH MINERALS) TABS tablet Take 1 tablet by mouth daily.    . Potassium 99 MG TABS Take 1 tablet by mouth 3 (three) times a week.      . sildenafil (VIAGRA) 25 MG tablet Take 25 mg by mouth daily as needed for erectile dysfunction.    . hydrocortisone (ANUSOL-HC) 25 MG suppository Place 1 suppository (25 mg total) rectally 2 (two) times daily. 20 suppository 1   No current facility-administered medications for this visit.   No Known Allergies   Review of Systems: Gen: Denies any fever, chills, sweats, anorexia, fatigue, weakness, malaise, weight loss, and sleep  disorder CV: Denies chest pain, angina, palpitations, syncope, orthopnea, PND, peripheral edema, and claudication. Resp: Denies dyspnea at rest, dyspnea with exercise, cough, sputum, wheezing, coughing up blood, and pleurisy. GI: Denies vomiting blood, jaundice, and fecal incontinence.   Denies dysphagia or odynophagia. GU : Denies urinary burning, blood in urine, urinary frequency, urinary hesitancy, nocturnal urination, and urinary incontinence. MS: Denies joint pain, limitation of movement, and swelling, stiffness, low back pain, extremity pain. Denies muscle weakness, cramps, atrophy.  Derm: Denies rash, itching, dry skin, hives, moles, warts, or unhealing ulcers.  Psych: Denies depression, anxiety, memory loss, suicidal ideation, hallucinations, paranoia, and confusion. Heme: Denies bruising, bleeding, and enlarged lymph nodes. Neuro:  Denies any headaches, dizziness, paresthesias. Endo:  Denies any problems with DM, thyroid, adrenal function    Physical Exam: BP 122/72 mmHg  Pulse 84  Ht 5' 7.5" (1.715 m)  Wt 152 lb (68.947 kg)  BMI 23.44 kg/m2 Constitutional: Pleasant,well-developed, male in no acute distress. HEENT: Normocephalic and atraumatic. Conjunctivae are normal. No scleral icterus. Neck supple. No thyromegaly Cardiovascular: Normal rate, regular rhythm.  Pulmonary/chest: Effort normal and breath sounds normal. No wheezing, rales or rhonchi. Abdominal: Soft, nondistended, nontender. Bowel sounds active throughout. There are no masses palpable. No hepatomegaly. Rectal: Small skin tag. Brown stool, Hemoccult negative. Extremities: no edema Lymphadenopathy: No cervical adenopathy noted. Neurological: Alert and oriented to person place and time. Skin: Skin is warm and dry. No rashes noted. Psychiatric: Normal mood and affect. Behavior is normal.  ASSESSMENT AND PLAN: 60 year old male with a history of adenomatous colon polyps presenting with complaints of 4-5 small formed  bowel movements daily often postprandially. He also complains of the sensation of incomplete evacuation. His symptoms are likely functional in nature and due to some irritable bowel. Dietary review reveals that he eats bacon and biscuits for breakfast, this gets and a meat for lunch. And meat and Posta for supper. He gets very little fiber. He's been instructed to adhere to a high-fiber low-fat diet. He will try Benefiber 1 heaping tablespoon in 6 ounces of water twice daily. For his sensation of incomplete evacuation, this may be due to internal hemorrhoids and he will be given a trial of Anusol HC suppositories 1 per rectum twice daily for 10 days. He is due for surveillance colonoscopy in the spring but prefers to schedule this now due to his work schedule. Therefore, he will be scheduled for surveillance colonoscopy to evaluate for recurrent polyps.The risks, benefits, and alternatives to colonoscopy with possible biopsy and possible polypectomy were discussed with the patient and they consent to proceed.  The patient would like to be scheduled for  hemorrhoidal banding  if he is noted to have internal hemorrhoids that are of the appropriate size for banding at the time of colonoscopy.    Shamon Cothran, Vita Barley PA-C 12/15/2015, 7:58 PM

## 2015-12-15 NOTE — Patient Instructions (Addendum)
We have sent the following medications to your pharmacy for you to pick up at your convenience: Anusol Suppositories 1 per rectum twice a day for 10 days  Please purchase the following medications over the counter and take as directed: Benefiber 1 heaping tablespoon in 6 oz of water twice a day  You have been scheduled for a colonoscopy. Please follow written instructions given to you at your visit today.  Please pick up your prep supplies at the pharmacy within the next 1-3 days. If you use inhalers (even only as needed), please bring them with you on the day of your procedure. Your physician has requested that you go to www.startemmi.com and enter the access code given to you at your visit today. This web site gives a general overview about your procedure. However, you should still follow specific instructions given to you by our office regarding your preparation for the procedure.

## 2015-12-18 NOTE — Progress Notes (Signed)
Agree w/ Ms. Hvozdovic's note and mangement.  

## 2015-12-20 ENCOUNTER — Telehealth: Payer: Self-pay | Admitting: Physician Assistant

## 2015-12-20 NOTE — Telephone Encounter (Signed)
He can try the Anusol HC cream, apply rectally twice a day to 3 times a day for 10 days. 12 with 1 refill.

## 2015-12-20 NOTE — Telephone Encounter (Signed)
Patient states Anusol is too expensive. Is there something else he can take? Please advise.

## 2015-12-21 ENCOUNTER — Other Ambulatory Visit: Payer: Self-pay | Admitting: Emergency Medicine

## 2015-12-21 MED ORDER — HYDROCORTISONE 2.5 % RE CREA: 1.0000 "application " | TOPICAL_CREAM | Freq: Two times a day (BID) | RECTAL | Status: DC

## 2015-12-21 NOTE — Telephone Encounter (Signed)
Left message for patient. Sent Rx to [facility].

## 2016-01-12 ENCOUNTER — Encounter: Payer: Self-pay | Admitting: Internal Medicine

## 2016-01-12 ENCOUNTER — Ambulatory Visit (AMBULATORY_SURGERY_CENTER): Payer: 59 | Admitting: Internal Medicine

## 2016-01-12 VITALS — BP 121/77 | HR 62 | Temp 99.3°F | Resp 22 | Ht 67.0 in | Wt 152.0 lb

## 2016-01-12 DIAGNOSIS — K589 Irritable bowel syndrome without diarrhea: Secondary | ICD-10-CM | POA: Diagnosis not present

## 2016-01-12 DIAGNOSIS — Z86010 Personal history of colonic polyps: Secondary | ICD-10-CM

## 2016-01-12 MED ORDER — SODIUM CHLORIDE 0.9 % IV SOLN: 500.0000 mL | INTRAVENOUS | Status: DC

## 2016-01-12 NOTE — Patient Instructions (Addendum)
The colonoscopy was normal.  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  I recommend that you take a probiotic (examples are Align, VSL #3) on a daily basis and give that 1-2 months to see if that helps. If it doesn't you can let me know and we can consider other treatment.  I appreciate the opportunity to care for you. Gatha Mayer, MD, FACG   YOU HAD AN ENDOSCOPIC PROCEDURE TODAY AT [facility]:   Refer to the procedure report that was given to you for any specific questions about what was found during the examination.  If the procedure report does not answer your questions, please call your gastroenterologist to clarify.  If you requested that your care partner not be given the details of your procedure findings, then the procedure report has been included in a sealed envelope for you to review at your convenience later.  YOU SHOULD EXPECT: Some feelings of bloating in the abdomen. Passage of more gas than usual.  Walking can help get rid of the air that was put into your GI tract during the procedure and reduce the bloating. If you had a lower endoscopy (such as a colonoscopy or flexible sigmoidoscopy) you may notice spotting of blood in your stool or on the toilet paper. If you underwent a bowel prep for your procedure, you may not have a normal bowel movement for a few days.  Please Note:  You might notice some irritation and congestion in your nose or some drainage.  This is from the oxygen used during your procedure.  There is no need for concern and it should clear up in a day or so.  SYMPTOMS TO REPORT IMMEDIATELY:   Following lower endoscopy (colonoscopy or flexible sigmoidoscopy):  Excessive amounts of blood in the stool  Significant tenderness or worsening of abdominal pains  Swelling of the abdomen that is new, acute  Fever of 100F or higher    For urgent or emergent  issues, a gastroenterologist can be reached at any hour by calling [phone].   DIET: Your first meal following the procedure should be a small meal and then it is ok to progress to your normal diet. Heavy or fried foods are harder to digest and may make you feel nauseous or bloated.  Likewise, meals heavy in dairy and vegetables can increase bloating.  Drink plenty of fluids but you should avoid alcoholic beverages for 24 hours.  ACTIVITY:  You should plan to take it easy for the rest of today and you should NOT DRIVE or use heavy machinery until tomorrow (because of the sedation medicines used during the test).    FOLLOW UP: Our staff will call the number listed on your records the next business day following your procedure to check on you and address any questions or concerns that you may have regarding the information given to you following your procedure. If we do not reach you, we will leave a message.  However, if you are feeling well and you are not experiencing any problems, there is no need to return our call.  We will assume that you have returned to your regular daily activities without incident.  If any biopsies were taken you will be contacted by phone or by letter within the next 1-3 weeks.  Please call us at [phone] if you have not heard about the biopsies in 3 weeks.  SIGNATURES/CONFIDENTIALITY: You and/or your care partner have signed paperwork which will be entered into your electronic medical record.  These signatures attest to the fact that that the information above on your After Visit Summary has been reviewed and is understood.  Full responsibility of the confidentiality of this discharge information lies with you and/or your care-partner.

## 2016-01-12 NOTE — Progress Notes (Signed)
Report to PACU, RN, vss, BBS= Clear.  

## 2016-01-12 NOTE — Op Note (Signed)
[place]  [address] [place], [id number]   COLONOSCOPY PROCEDURE REPORT  PATIENT: Ricardo [name], Ricardo [name]  MR#: [id number] BIRTHDATE: [birth year] , 59  yrs. old GENDER: male ENDOSCOPIST: Gatha Mayer, MD, [facility] PROCEDURE DATE:  01/12/2016 PROCEDURE:   Colonoscopy, diagnostic First Screening Colonoscopy - Avg.  risk and is 50 yrs.  old or older - No.  Prior Negative Screening - Now for repeat screening. N/A  History of Adenoma - Now for follow-up colonoscopy & has been > or = to 3 yrs.  Yes hx of adenoma.  Has been 3 or more years since last colonoscopy.  Polyps removed today? No Polyps removed today? No Recommend repeat exam, <10 yrs? No ASA CLASS:   Class II INDICATIONS:Change in bowel habits, Clinically significant diarrhea of unexplained origin, and Patient is not applicable for Colorectal Neoplasm Risk Assessment for this procedure. MEDICATIONS: Propofol 180 mg IV and Monitored anesthesia care  DESCRIPTION OF PROCEDURE:   After the risks benefits and alternatives of the procedure were thoroughly explained, informed consent was obtained.  The digital rectal exam revealed no rectal mass and revealed a surgically absent prostate.   The LB [id number]  endoscope was introduced through the anus and advanced to the terminal ileum which was intubated for a short distance. No adverse events experienced.   The quality of the prep was excellent.  (MiraLax was used)  The instrument was then slowly withdrawn as the colon was fully examined. Estimated blood loss is zero unless otherwise noted in this procedure report.      COLON FINDINGS: The examined terminal ileum appeared to be normal. The colonic mucosa appeared normal.  Retroflexed views revealed no abnormalities. The time to cecum = 2.0 Withdrawal time = 5.9   The scope was withdrawn and the procedure completed. COMPLICATIONS: There were no immediate complications.  ENDOSCOPIC IMPRESSION: 1.   The examined  terminal ileum appeared to be normal 2.   The colonic mucosa appeared normal - excellent prep  RECOMMENDATIONS: 1.  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  Try probiotic. 2.  Repeat colonoscopy 10 years.  eSigned:  Gatha Mayer, MD, [facility] 01/12/2016 4:15 PM   cc: The Patient and Dr. Rogene Houston, Brooke Bonito

## 2016-01-13 ENCOUNTER — Telehealth: Payer: Self-pay | Admitting: Emergency Medicine

## 2016-01-13 NOTE — Telephone Encounter (Signed)
Left message, no identifier present

## 2016-02-02 ENCOUNTER — Ambulatory Visit: Payer: Self-pay | Admitting: Internal Medicine

## 2016-02-17 NOTE — Progress Notes (Signed)
This encounter was created in error - please disregard.

## 2016-03-12 ENCOUNTER — Telehealth: Payer: Self-pay | Admitting: Internal Medicine

## 2016-03-15 NOTE — Telephone Encounter (Signed)
Left message for patient to call back  

## 2016-03-15 NOTE — Telephone Encounter (Signed)
I called patient and attempted to explain that he had a diagnostic procedure due to symptoms.  Patient did not want to speak with me and asked "why are you calling me".  I attempted to explain that I was asked to call and explain why procedure was diagnostic.  He stated that he has been speaking with Marzetta Board and "you are billing me twice, my insurance has already paid you and now you are billing me again".  I explained that we have no Stacy in this office and he said that's who he has been speaking with and will wait for her to call back.  He then hung up.

## 2016-12-28 ENCOUNTER — Encounter (INDEPENDENT_AMBULATORY_CARE_PROVIDER_SITE_OTHER): Payer: Self-pay

## 2016-12-28 ENCOUNTER — Ambulatory Visit (INDEPENDENT_AMBULATORY_CARE_PROVIDER_SITE_OTHER): Payer: 59 | Admitting: Internal Medicine

## 2016-12-28 ENCOUNTER — Encounter: Payer: Self-pay | Admitting: Internal Medicine

## 2016-12-28 VITALS — BP 128/84 | HR 67 | Ht 67.5 in | Wt 161.8 lb

## 2016-12-28 DIAGNOSIS — I4891 Unspecified atrial fibrillation: Secondary | ICD-10-CM

## 2016-12-28 MED ORDER — RIVAROXABAN 20 MG PO TABS: 20.0000 mg | ORAL_TABLET | Freq: Every day | ORAL | 11 refills | Status: DC

## 2016-12-28 NOTE — Patient Instructions (Addendum)
Medication Instructions:  Your physician has recommended you make the following change in your medication:  1) Start Xarelto 20 mg daily    Labwork: Your physician recommends that you return for lab work on 01/08/17--do not have to fast   Testing/Procedures: Your physician has requested that you have an echocardiogram. Echocardiography is a painless test that uses sound waves to create images of your heart. It provides your doctor with information about the size and shape of your heart and how well your heart's chambers and valves are working. This procedure takes approximately one hour. There are no restrictions for this procedure.  Your physician has requested that you have cardiac CT. Cardiac computed tomography (CT) is a painless test that uses an x-ray machine to take clear, detailed pictures of your heart. For further information please visit [employer]. Please follow instruction sheet as given.--- week of 01/14/17, office will call and schedule when approved by insurance   Your physician has recommended that you have an ablation. Catheter ablation is a medical procedure used to treat some cardiac arrhythmias (irregular heartbeats). During catheter ablation, a long, thin, flexible tube is put into a blood vessel in your groin (upper thigh), or neck. This tube is called an ablation catheter. It is then guided to your heart through the blood vessel. Radio frequency waves destroy small areas of heart tissue where abnormal heartbeats may cause an arrhythmia to start. Please see the instruction sheet given to you today.--01/22/17  Please arrive at [facility] at 5:30am Do not eat or drink after midnight the night prior to the procedure Do not take any medications the morning of the procedure Plan for one night stay  Will need someone to drive you home at discharge    Follow-Up: Your physician recommends that you schedule a follow-up  appointment in: 4 weeks from 01/22/17 in the afib clinic and 3 months from 01/22/17 with Dr Rayann Heman   Any Other Special Instructions Will Be Listed Below (If Applicable).     If you need a refill on your cardiac medications before your next appointment, please call your pharmacy.

## 2016-12-28 NOTE — Progress Notes (Signed)
Electrophysiology Office Note   Date:  12/28/2016   ID:  Zion, Kaercher 09/02/1956, MRN [id number]  PCP:  Leda Quail, MD   Primary Electrophysiologist: Thompson Grayer, MD (previously Dr Casper Harrison at [facility])  Chief Complaint  Patient presents with  . Atrial Fibrillation     History of Present Illness: JAHOD RADDE is a 61 y.o. male who presents today for electrophysiology evaluation.   The patient reports having afib for over 10 years.  He has been followed previously by Dr Casper Harrison.  He has been treated for multaq and has done quite well.  Ablation was previously offered and he declined.  He finds that stress and alcohol are triggers for his afib.   Recently episodes have increased in frequency and duration.  He has multiple episodes per week, typically lasting minutes to hours.  He feels "washed out" afterwards.  He also has occasional shortness of breath of unclear etiology.  Today, he denies symptoms of chest pain,  orthopnea, PND, lower extremity edema, claudication, dizziness, presyncope, syncope, bleeding, or neurologic sequela. The patient is tolerating medications without difficulties and is otherwise without complaint today.    Past Medical History:  Diagnosis Date  . Carpal tunnel syndrome 11-03-13   bilateral numbness-worse while sleep-remains an issue  . History of kidney stones    '12 x 1 episode  . Left inguinal hernia   . Paroxysmal atrial fibrillation (HCC)   . Prostate cancer [facility])    prostate   Past Surgical History:  Procedure Laterality Date  . COLONOSCOPY  03/2006; 07/16/2011   2007: rectal adenoma 2012:Normal  . ELBOW SURGERY     bilateral ulnar nerve release  . HERNIA REPAIR Left 11/06/13   LIH  . INGUINAL HERNIA REPAIR Left 11/06/2013   Procedure: HERNIA REPAIR INGUINAL ADULT;  Surgeon: Odis Hollingshead, MD;  Location: [facility];  Service: General;  Laterality: Left;  . INSERTION OF MESH Left 11/06/2013   Procedure: INSERTION  OF MESH;  Surgeon: Odis Hollingshead, MD;  Location: [facility];  Service: General;  Laterality: Left;  . KNEE ARTHROSCOPY     left  . PROSTATE BIOPSY  05/04/2011   adenocarcinoma Gleason 6  . ROBOT ASSISTED LAPAROSCOPIC RADICAL PROSTATECTOMY  09/24/2011   Procedure: ROBOTIC ASSISTED LAPAROSCOPIC RADICAL PROSTATECTOMY LEVEL 1;  Surgeon: Dutch Gray, MD;  Location: [facility];  Service: Urology;  Laterality: N/A;     Current Outpatient Prescriptions  Medication Sig Dispense Refill  . aspirin 81 MG tablet Take 81 mg by mouth daily. Reported on 01/12/2016    . hydrocortisone (ANUSOL-HC) 2.5 % rectal cream Place 1 application rectally 2 (two) times daily. For ten days. 30 g 1  . Misc Natural Products (OSTEO BI-FLEX ADV DOUBLE ST PO) Take 1 tablet by mouth daily. Reported on 01/12/2016    . MULTAQ 400 MG tablet Take 1 tablet by mouth Twice daily.    . Multiple Vitamin (MULTIVITAMIN WITH MINERALS) TABS tablet Take 1 tablet by mouth 2 (two) times daily. Reported on 01/12/2016    . sildenafil (VIAGRA) 25 MG tablet Take 25 mg by mouth daily as needed for erectile dysfunction. Reported on 01/12/2016    . POTASSIUM PO Take 1 tablet by mouth daily as needed (leg cramps). Only takes as needed during the summer    . rivaroxaban (XARELTO) 20 MG TABS tablet Take 1 tablet (20 mg total) by mouth daily with supper. 30 tablet 11   No current facility-administered medications for this  visit.     Allergies:   Patient has no known allergies.   Social History:  The patient  reports that he quit smoking about 15 years ago. He has never used smokeless tobacco. He reports that he drinks about 7.2 oz of alcohol per week . He reports that he does not use drugs.   Family History:  The patient's  family history includes Prostate cancer in his father.    ROS:  Please see the history of present illness.   All other systems are reviewed and negative.    PHYSICAL EXAM: VS:  BP 128/84   Pulse 67   Ht 5' 7.5" (1.715 m)   Wt 161  lb 12.8 oz (73.4 kg)   BMI 24.97 kg/m  , BMI Body mass index is 24.97 kg/m. GEN: Well nourished, well developed, in no acute distress  HEENT: normal  Neck: no JVD, carotid bruits, or masses Cardiac: RRR; no murmurs, rubs, or gallops,no edema  Respiratory:  clear to auscultation bilaterally, normal work of breathing GI: soft, nontender, nondistended, + BS MS: no deformity or atrophy  Skin: warm and dry  Neuro:  Strength and sensation are intact Psych: euthymic mood, full affect  EKG:  EKG is ordered today. The ekg ordered today shows sinus rhythm  Wt Readings from Last 3 Encounters:  12/28/16 161 lb 12.8 oz (73.4 kg)  01/12/16 152 lb (68.9 kg)  12/15/15 152 lb (68.9 kg)     Other studies Reviewed: Additional studies/ records that were reviewed today include: records from [facility] regional  Review of the above records today demonstrates: echo and stress test from 2016,  Monitor from 2016 reveals paroxysmal afib   ASSESSMENT AND PLAN:  1.  Paroxysmal atrial fibrillation The patient has symptomatic recurrent paroxysmal atrial fibrillation.  He has ongoing episodes despite medical therapy with multaq. Therapeutic strategies for afib including medicine and ablation were discussed in detail with the patient today. Risk, benefits, and alternatives to EP study and radiofrequency ablation for afib were also discussed in detail today. These risks include but are not limited to stroke, bleeding, vascular damage, tamponade, perforation, damage to the esophagus, lungs, and other structures, pulmonary vein stenosis, worsening renal function, and death. The patient understands these risk and wishes to proceed.  We will therefore proceed with catheter ablation once the patient has been adequately anticoagulated.  Will start xarelto 20mg  daily today.  chads2vasc score is 0. Echo Cardiac CT is ordered to evaluate SOB and also exclude LAA thrombus prior to ablation. ETOh cessation is encouraged  2.  SOB Echo, cardiac CT as above  Current medicines are reviewed at length with the patient today.   The patient does not have concerns regarding his medicines.  The following changes were made today:  none  Labs/ tests ordered today include:  Orders Placed This Encounter  Procedures  . CT CARDIAC MORPH/PULM VEIN W/CM&W/O CA SCORE  . Basic metabolic panel  . CBC with Differential  . EKG 12-Lead  . ECHOCARDIOGRAM COMPLETE     Signed, Thompson Grayer, MD  12/28/2016 3:43 PM     [address] [place] [address] [phone] (office) [phone] (fax)

## 2017-01-04 ENCOUNTER — Encounter: Payer: Self-pay | Admitting: Internal Medicine

## 2017-01-08 ENCOUNTER — Other Ambulatory Visit: Payer: 59 | Admitting: *Deleted

## 2017-01-08 DIAGNOSIS — I4891 Unspecified atrial fibrillation: Secondary | ICD-10-CM

## 2017-01-08 LAB — CBC WITH DIFFERENTIAL/PLATELET
BASOS ABS: 0 10*3/uL (ref 0.0–0.2)
BASOS: 1 %
EOS (ABSOLUTE): 0.3 10*3/uL (ref 0.0–0.4)
Eos: 5 %
HEMOGLOBIN: 15.5 g/dL (ref 13.0–17.7)
Hematocrit: 45 % (ref 37.5–51.0)
IMMATURE GRANS (ABS): 0 10*3/uL (ref 0.0–0.1)
Immature Granulocytes: 0 %
LYMPHS: 38 %
Lymphocytes Absolute: 1.8 10*3/uL (ref 0.7–3.1)
MCH: 33 pg (ref 26.6–33.0)
MCHC: 34.4 g/dL (ref 31.5–35.7)
MCV: 96 fL (ref 79–97)
MONOCYTES: 10 %
Monocytes Absolute: 0.5 10*3/uL (ref 0.1–0.9)
NEUTROS ABS: 2.2 10*3/uL (ref 1.4–7.0)
Neutrophils: 46 %
Platelets: 175 10*3/uL (ref 150–379)
RBC: 4.69 x10E6/uL (ref 4.14–5.80)
RDW: 13.7 % (ref 12.3–15.4)
WBC: 4.8 10*3/uL (ref 3.4–10.8)

## 2017-01-08 LAB — BASIC METABOLIC PANEL
BUN/Creatinine Ratio: 16 (ref 10–24)
BUN: 16 mg/dL (ref 8–27)
CALCIUM: 9.2 mg/dL (ref 8.6–10.2)
CO2: 22 mmol/L (ref 18–29)
Chloride: 101 mmol/L (ref 96–106)
Creatinine, Ser: 1 mg/dL (ref 0.76–1.27)
GFR, EST AFRICAN AMERICAN: 93 (ref >59)
GFR, EST NON AFRICAN AMERICAN: 81 (ref >59)
Glucose: 98 mg/dL (ref 65–99)
Potassium: 4.1 mmol/L (ref 3.5–5.2)
Sodium: 140 mmol/L (ref 134–144)

## 2017-01-11 ENCOUNTER — Other Ambulatory Visit: Payer: Self-pay

## 2017-01-11 ENCOUNTER — Ambulatory Visit (HOSPITAL_COMMUNITY): Payer: 59 | Attending: Cardiology

## 2017-01-11 DIAGNOSIS — I517 Cardiomegaly: Secondary | ICD-10-CM | POA: Diagnosis not present

## 2017-01-11 DIAGNOSIS — I5189 Other ill-defined heart diseases: Secondary | ICD-10-CM | POA: Diagnosis not present

## 2017-01-11 DIAGNOSIS — I361 Nonrheumatic tricuspid (valve) insufficiency: Secondary | ICD-10-CM | POA: Diagnosis not present

## 2017-01-11 DIAGNOSIS — I4891 Unspecified atrial fibrillation: Secondary | ICD-10-CM | POA: Insufficient documentation

## 2017-01-11 LAB — ECHOCARDIOGRAM COMPLETE
Ao-asc: 34 cm
CHL CUP DOP CALC LVOT VTI: 18.5 cm
CHL CUP MV DEC (S): 261
E/e' ratio: 7.64
EWDT: 261 ms
FS: 39 % (ref 28–44)
IVS/LV PW RATIO, ED: 0.98
LA ID, A-P, ES: 37 mm
LA diam index: 1.99 cm/m2
LA vol A4C: 50 ml
LA vol index: 25.3 mL/m2
LAVOL: 47 mL
LDCA: 3.8 cm2
LEFT ATRIUM END SYS DIAM: 37 mm
LV E/e' medial: 7.64
LV E/e'average: 7.64
LV PW d: 10.3 mm — AB (ref 0.6–1.1)
LV TDI E'LATERAL: 8.33
LV e' LATERAL: 8.33 cm/s
LVOT SV: 70 mL
LVOT peak grad rest: 3 mmHg
LVOTD: 22 mm
LVOTPV: 91.2 cm/s
MV pk A vel: 84.8 m/s
MV pk E vel: 63.6 m/s
Reg peak vel: 191 cm/s
TDI e' medial: 9.21
TRMAXVEL: 191 cm/s

## 2017-01-18 ENCOUNTER — Ambulatory Visit (HOSPITAL_COMMUNITY)
Admission: RE | Admit: 2017-01-18 | Discharge: 2017-01-18 | Disposition: A | Discharge status: Home / Self Care | Payer: 59 | Source: Ambulatory Visit | Attending: Internal Medicine | Admitting: Internal Medicine

## 2017-01-18 ENCOUNTER — Encounter (HOSPITAL_COMMUNITY): Payer: Self-pay

## 2017-01-18 DIAGNOSIS — I517 Cardiomegaly: Secondary | ICD-10-CM | POA: Insufficient documentation

## 2017-01-18 DIAGNOSIS — I4891 Unspecified atrial fibrillation: Secondary | ICD-10-CM | POA: Diagnosis not present

## 2017-01-18 IMAGING — CT CT HEART MORPH/PULM VEIN W/ CM & W/O CA SCORE
1 series · 1 of 1 positions shown, 2 images · IV contrast (isovue)
Comparison: None
COMPARISON: None

EXAM:
OVER-READ INTERPRETATION  CT CHEST

The following report is an over-read performed by radiologist Dr.
Markia Bukhari [[email or web address]] on 01/18/2017. This over-read
does not include interpretation of cardiac or coronary anatomy or
pathology. The coronary CTA Interpretation by the cardiologist is
attached.
CLINICAL DATA: Pre Ablation
Cardiac Gated CTA
TECHNIQUE: The patient was scanned on a [employer] [REDACTED]ice scanner. Gantry
rotation speed was 270 msec. A prospective scan was triggered in the
descending thoracic aorta at 111 HU's Data sets were reconstructed
at 78% R-R interval with 5% padding. Images were reviewed using VRT,
MIP and MPR modes. Double oblique images were used to measure the PV
diameter and areas. The patient received 80 cc of contrast at 5
cc/sec
CONTRAST:  Isovue 370 total 80 cc

[Series 216: laa · 0.41mm/px · 1 of 1 slices shown, 2 images]
[im 1/1  vessel]
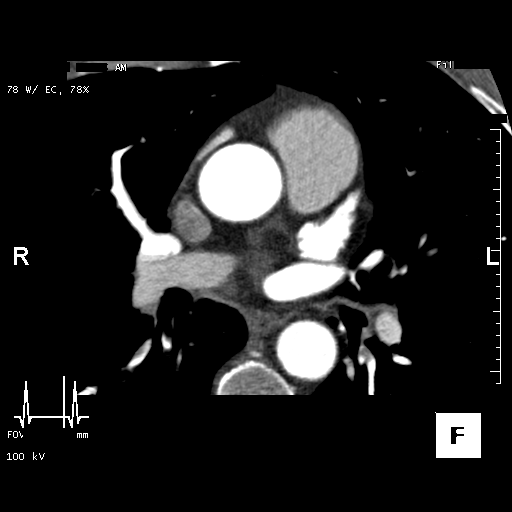
[im 1/1  lung]
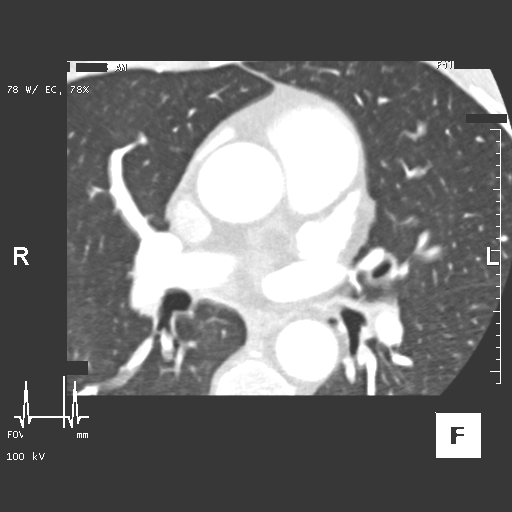

[1 of 1 positions shown; findings below may reference images not displayed]

FINDINGS: Heart is normal size. Aorta is normal caliber. Scattered small
mediastinal lymph nodes, none pathologically enlarged. Lungs are
clear. No focal airspace opacities or suspicious nodules. No
effusions. Imaging into the upper abdomen shows no acute findings.
Chest wall soft tissues are unremarkable. No acute bony abnormality.
IMPRESSION: No acute or significant extracardiac abnormality.
FINDINGS: There was mild biatrial enlargement. There was no ASD/VSD. No
pericardial effusion The POGI was without thrombus. The pulmonary
veins drained normally into the LA There was a small right

Middle pulmonary vein. There were no anomalies. The esophagus
coursed closest to the LLPV ostium.

LUPV:  Ostium 15 mm   area 1.6 cm2

LLPV:   Ostium 17.5 mm  area 1.3 cm2

RUPV:  Ostium 12 mm  area 1.5 cm2

RLPV:  Ostium 11 mm  area 1.3 cm2

RMPV:  Ostium 8 mm     area.4 cm2
IMPRESSION: 1) No POGI thrombus

2) Mild biatrial enlargement

3) Normal pulmonary veins with small RMPV

4) Esophagus courses closest to the the LLPV

5) No ASD/PFO

## 2017-01-18 MED ORDER — IOPAMIDOL (ISOVUE-370) INJECTION 76%
INTRAVENOUS | Status: AC
  Administered 2017-01-18: 80 mL
  Filled 2017-01-18: qty 100

## 2017-01-18 NOTE — Progress Notes (Signed)
Patient stated last took Viagra 3-4 days ago.

## 2017-01-18 NOTE — Progress Notes (Signed)
Spoke with Cardiologist no medication to be given. CT notified.

## 2017-01-22 ENCOUNTER — Ambulatory Visit (HOSPITAL_COMMUNITY): Payer: 59 | Admitting: Anesthesiology

## 2017-01-22 ENCOUNTER — Encounter (HOSPITAL_COMMUNITY)
Admission: RE | Disposition: A | Discharge status: Home / Self Care | Payer: Self-pay | Source: Ambulatory Visit | Attending: Internal Medicine

## 2017-01-22 ENCOUNTER — Encounter (HOSPITAL_COMMUNITY): Payer: Self-pay | Admitting: Certified Registered"

## 2017-01-22 ENCOUNTER — Ambulatory Visit (HOSPITAL_COMMUNITY)
Admission: RE | Admit: 2017-01-22 | Discharge: 2017-01-22 | Disposition: A | Discharge status: Home / Self Care | Payer: 59 | Source: Ambulatory Visit | Attending: Internal Medicine | Admitting: Internal Medicine

## 2017-01-22 DIAGNOSIS — Z7901 Long term (current) use of anticoagulants: Secondary | ICD-10-CM | POA: Insufficient documentation

## 2017-01-22 DIAGNOSIS — I480 Paroxysmal atrial fibrillation: Secondary | ICD-10-CM | POA: Insufficient documentation

## 2017-01-22 DIAGNOSIS — I483 Typical atrial flutter: Secondary | ICD-10-CM | POA: Insufficient documentation

## 2017-01-22 DIAGNOSIS — I4891 Unspecified atrial fibrillation: Secondary | ICD-10-CM | POA: Diagnosis present

## 2017-01-22 DIAGNOSIS — Z7982 Long term (current) use of aspirin: Secondary | ICD-10-CM | POA: Diagnosis not present

## 2017-01-22 DIAGNOSIS — Z87891 Personal history of nicotine dependence: Secondary | ICD-10-CM | POA: Insufficient documentation

## 2017-01-22 HISTORY — PX: ATRIAL FIBRILLATION ABLATION: EP1191

## 2017-01-22 LAB — POCT ACTIVATED CLOTTING TIME
ACTIVATED CLOTTING TIME: 384 s
Activated Clotting Time: 395 seconds
Activated Clotting Time: 136 seconds

## 2017-01-22 SURGERY — ATRIAL FIBRILLATION ABLATION
Anesthesia: Monitor Anesthesia Care

## 2017-01-22 MED ORDER — LIDOCAINE HCL (CARDIAC) 20 MG/ML IV SOLN
INTRAVENOUS | Status: DC | PRN
  Administered 2017-01-22: 20 mg via INTRAVENOUS

## 2017-01-22 MED ORDER — ACETAMINOPHEN 325 MG PO TABS
ORAL_TABLET | ORAL | Status: AC
  Filled 2017-01-22: qty 2

## 2017-01-22 MED ORDER — PHENYLEPHRINE HCL 10 MG/ML IJ SOLN
INTRAMUSCULAR | Status: DC | PRN
  Administered 2017-01-22: 15 ug/min via INTRAVENOUS

## 2017-01-22 MED ORDER — BUPIVACAINE HCL (PF) 0.25 % IJ SOLN
INTRAMUSCULAR | Status: DC | PRN
  Administered 2017-01-22: 20 mL

## 2017-01-22 MED ORDER — SODIUM CHLORIDE 0.9% FLUSH: 3.0000 mL | Freq: Two times a day (BID) | INTRAVENOUS | Status: DC

## 2017-01-22 MED ORDER — SODIUM CHLORIDE 0.9 % IV SOLN: 250.0000 mL | INTRAVENOUS | Status: DC | PRN

## 2017-01-22 MED ORDER — FENTANYL CITRATE (PF) 100 MCG/2ML IJ SOLN
INTRAMUSCULAR | Status: DC | PRN
  Administered 2017-01-22 (×4): 25 ug via INTRAVENOUS

## 2017-01-22 MED ORDER — RIVAROXABAN 20 MG PO TABS
20.0000 mg | ORAL_TABLET | Freq: Every day | ORAL | Status: DC
  Administered 2017-01-22: 20 mg via ORAL
  Filled 2017-01-22: qty 1

## 2017-01-22 MED ORDER — ISOPROTERENOL HCL 0.2 MG/ML IJ SOLN
INTRAMUSCULAR | Status: AC
  Filled 2017-01-22: qty 5

## 2017-01-22 MED ORDER — LACTATED RINGERS IV SOLN: INTRAVENOUS | Status: DC | PRN

## 2017-01-22 MED ORDER — IOPAMIDOL (ISOVUE-370) INJECTION 76%
INTRAVENOUS | Status: DC | PRN
  Administered 2017-01-22: 2 mL

## 2017-01-22 MED ORDER — SODIUM CHLORIDE 0.9 % IV SOLN
INTRAVENOUS | Status: DC | PRN
  Administered 2017-01-22: 08:00:00 via INTRAVENOUS

## 2017-01-22 MED ORDER — ACETAMINOPHEN 325 MG PO TABS
650.0000 mg | ORAL_TABLET | ORAL | Status: DC | PRN
  Administered 2017-01-22: 650 mg via ORAL
  Filled 2017-01-22: qty 2

## 2017-01-22 MED ORDER — PROPOFOL 500 MG/50ML IV EMUL
INTRAVENOUS | Status: DC | PRN
  Administered 2017-01-22: 150 ug/kg/min via INTRAVENOUS

## 2017-01-22 MED ORDER — HYDROCODONE-ACETAMINOPHEN 5-325 MG PO TABS: 1.0000 | ORAL_TABLET | ORAL | Status: DC | PRN

## 2017-01-22 MED ORDER — SODIUM CHLORIDE 0.9 % IV SOLN
INTRAVENOUS | Status: DC
  Administered 2017-01-22: 07:00:00 via INTRAVENOUS

## 2017-01-22 MED ORDER — PROTAMINE SULFATE 10 MG/ML IV SOLN
INTRAVENOUS | Status: DC | PRN
  Administered 2017-01-22: 30 mg via INTRAVENOUS

## 2017-01-22 MED ORDER — ONDANSETRON HCL 4 MG/2ML IJ SOLN: 4.0000 mg | Freq: Four times a day (QID) | INTRAMUSCULAR | Status: DC | PRN

## 2017-01-22 MED ORDER — HEPARIN SODIUM (PORCINE) 1000 UNIT/ML IJ SOLN
INTRAMUSCULAR | Status: AC
  Filled 2017-01-22: qty 1

## 2017-01-22 MED ORDER — HEPARIN SODIUM (PORCINE) 1000 UNIT/ML IJ SOLN
INTRAMUSCULAR | Status: DC | PRN
  Administered 2017-01-22 (×2): 1000 [IU] via INTRAVENOUS

## 2017-01-22 MED ORDER — PANTOPRAZOLE SODIUM 20 MG PO TBEC: 20.0000 mg | DELAYED_RELEASE_TABLET | Freq: Every day | ORAL | 5 refills | Status: DC

## 2017-01-22 MED ORDER — HEPARIN SODIUM (PORCINE) 1000 UNIT/ML IJ SOLN
INTRAMUSCULAR | Status: DC | PRN
  Administered 2017-01-22: 12000 [IU] via INTRAVENOUS

## 2017-01-22 MED ORDER — IOPAMIDOL (ISOVUE-370) INJECTION 76%
INTRAVENOUS | Status: AC
  Filled 2017-01-22: qty 50

## 2017-01-22 MED ORDER — BUPIVACAINE HCL (PF) 0.25 % IJ SOLN
INTRAMUSCULAR | Status: AC
  Filled 2017-01-22: qty 30

## 2017-01-22 MED ORDER — SODIUM CHLORIDE 0.9% FLUSH: 3.0000 mL | INTRAVENOUS | Status: DC | PRN

## 2017-01-22 MED ORDER — MIDAZOLAM HCL 5 MG/5ML IJ SOLN
INTRAMUSCULAR | Status: DC | PRN
  Administered 2017-01-22: 2 mg via INTRAVENOUS

## 2017-01-22 MED ORDER — PANTOPRAZOLE SODIUM 40 MG PO TBEC: 40.0000 mg | DELAYED_RELEASE_TABLET | Freq: Every day | ORAL | 0 refills | Status: DC

## 2017-01-22 MED ORDER — ISOPROTERENOL HCL 0.2 MG/ML IJ SOLN
INTRAMUSCULAR | Status: DC | PRN
  Administered 2017-01-22: 20 ug/min via INTRAVENOUS

## 2017-01-22 SURGICAL SUPPLY — 18 items
BAG SNAP BAND KOVER 36X36 (MISCELLANEOUS) ×2 IMPLANT
BLANKET WARM UNDERBOD FULL ACC (MISCELLANEOUS) ×2 IMPLANT
CATH NAVISTAR SMARTTOUCH DF (ABLATOR) ×1 IMPLANT
CATH SOUNDSTAR 3D IMAGING (CATHETERS) ×1 IMPLANT
CATH VARIABLE LASSO NAV 2515 (CATHETERS) ×1 IMPLANT
CATH WEBSTER BI DIR CS D-F CRV (CATHETERS) ×1 IMPLANT
COVER SWIFTLINK CONNECTOR (BAG) ×2 IMPLANT
NDL TRANSEP BRK 71CM 407200 (NEEDLE) IMPLANT
NEEDLE TRANSEP BRK 71CM 407200 (NEEDLE) ×2 IMPLANT
PACK EP LATEX FREE (CUSTOM PROCEDURE TRAY) ×2
PACK EP LF (CUSTOM PROCEDURE TRAY) ×1 IMPLANT
PAD DEFIB LIFELINK (PAD) ×2 IMPLANT
PATCH CARTO3 (PAD) ×1 IMPLANT
SHEATH AVANTI 11F 11CM (SHEATH) ×1 IMPLANT
SHEATH PINNACLE 7F 10CM (SHEATH) ×2 IMPLANT
SHEATH PINNACLE 9F 10CM (SHEATH) ×1 IMPLANT
SHEATH SWARTZ TS SL2 63CM 8.5F (SHEATH) ×1 IMPLANT
TUBING SMART ABLATE COOLFLOW (TUBING) ×1 IMPLANT

## 2017-01-22 NOTE — Progress Notes (Signed)
Site area: Right groin a 7, 9, 11 venous sheaths were removed  Site Prior to Removal:  Level 0  Pressure Applied For 15 MINUTES    Bedrest Beginning at  1130a  Manual:   Yes.    Patient Status During Pull:  stable  Post Pull Groin Site:  Level 0  Post Pull Instructions Given:  Yes.    Post Pull Pulses Present:  Yes.    Dressing Applied:  Yes.    Comments:  VS remain stable during sheath pull

## 2017-01-22 NOTE — H&P (View-Only) (Signed)
Electrophysiology Office Note   Date:  12/28/2016   ID:  Wyn, Flam 1956/05/10, MRN [id number]  PCP:  Leda Quail, MD   Primary Electrophysiologist: Thompson Grayer, MD (previously Dr Casper Harrison at [facility])  Chief Complaint  Patient presents with  . Atrial Fibrillation     History of Present Illness: Ricardo [name] is a 61 y.o. male who presents today for electrophysiology evaluation.   The patient reports having afib for over 10 years.  He has been followed previously by Dr Casper Harrison.  He has been treated for multaq and has done quite well.  Ablation was previously offered and he declined.  He finds that stress and alcohol are triggers for his afib.   Recently episodes have increased in frequency and duration.  He has multiple episodes per week, typically lasting minutes to hours.  He feels "washed out" afterwards.  He also has occasional shortness of breath of unclear etiology.  Today, he denies symptoms of chest pain,  orthopnea, PND, lower extremity edema, claudication, dizziness, presyncope, syncope, bleeding, or neurologic sequela. The patient is tolerating medications without difficulties and is otherwise without complaint today.    Past Medical History:  Diagnosis Date  . Carpal tunnel syndrome 11-03-13   bilateral numbness-worse while sleep-remains an issue  . History of kidney stones    '12 x 1 episode  . Left inguinal hernia   . Paroxysmal atrial fibrillation (HCC)   . Prostate cancer [facility])    prostate   Past Surgical History:  Procedure Laterality Date  . COLONOSCOPY  03/2006; 07/16/2011   2007: rectal adenoma 2012:Normal  . ELBOW SURGERY     bilateral ulnar nerve release  . HERNIA REPAIR Left 11/06/13   LIH  . INGUINAL HERNIA REPAIR Left 11/06/2013   Procedure: HERNIA REPAIR INGUINAL ADULT;  Surgeon: Odis Hollingshead, MD;  Location: [facility];  Service: General;  Laterality: Left;  . INSERTION OF MESH Left 11/06/2013   Procedure: INSERTION  OF MESH;  Surgeon: Odis Hollingshead, MD;  Location: [facility];  Service: General;  Laterality: Left;  . KNEE ARTHROSCOPY     left  . PROSTATE BIOPSY  05/04/2011   adenocarcinoma Gleason 6  . ROBOT ASSISTED LAPAROSCOPIC RADICAL PROSTATECTOMY  09/24/2011   Procedure: ROBOTIC ASSISTED LAPAROSCOPIC RADICAL PROSTATECTOMY LEVEL 1;  Surgeon: Dutch Gray, MD;  Location: [facility];  Service: Urology;  Laterality: N/A;     Current Outpatient Prescriptions  Medication Sig Dispense Refill  . aspirin 81 MG tablet Take 81 mg by mouth daily. Reported on 01/12/2016    . hydrocortisone (ANUSOL-HC) 2.5 % rectal cream Place 1 application rectally 2 (two) times daily. For ten days. 30 g 1  . Misc Natural Products (OSTEO BI-FLEX ADV DOUBLE ST PO) Take 1 tablet by mouth daily. Reported on 01/12/2016    . MULTAQ 400 MG tablet Take 1 tablet by mouth Twice daily.    . Multiple Vitamin (MULTIVITAMIN WITH MINERALS) TABS tablet Take 1 tablet by mouth 2 (two) times daily. Reported on 01/12/2016    . sildenafil (VIAGRA) 25 MG tablet Take 25 mg by mouth daily as needed for erectile dysfunction. Reported on 01/12/2016    . POTASSIUM PO Take 1 tablet by mouth daily as needed (leg cramps). Only takes as needed during the summer    . rivaroxaban (XARELTO) 20 MG TABS tablet Take 1 tablet (20 mg total) by mouth daily with supper. 30 tablet 11   No current facility-administered medications for this  visit.     Allergies:   Patient has no known allergies.   Social History:  The patient  reports that he quit smoking about 15 years ago. He has never used smokeless tobacco. He reports that he drinks about 7.2 oz of alcohol per week . He reports that he does not use drugs.   Family History:  The patient's  family history includes Prostate cancer in his father.    ROS:  Please see the history of present illness.   All other systems are reviewed and negative.    PHYSICAL EXAM: VS:  BP 128/84   Pulse 67   Ht 5' 7.5" (1.715 m)   Wt 161  lb 12.8 oz (73.4 kg)   BMI 24.97 kg/m  , BMI Body mass index is 24.97 kg/m. GEN: Well nourished, well developed, in no acute distress  HEENT: normal  Neck: no JVD, carotid bruits, or masses Cardiac: RRR; no murmurs, rubs, or gallops,no edema  Respiratory:  clear to auscultation bilaterally, normal work of breathing GI: soft, nontender, nondistended, + BS MS: no deformity or atrophy  Skin: warm and dry  Neuro:  Strength and sensation are intact Psych: euthymic mood, full affect  EKG:  EKG is ordered today. The ekg ordered today shows sinus rhythm  Wt Readings from Last 3 Encounters:  12/28/16 161 lb 12.8 oz (73.4 kg)  01/12/16 152 lb (68.9 kg)  12/15/15 152 lb (68.9 kg)     Other studies Reviewed: Additional studies/ records that were reviewed today include: records from [facility] regional  Review of the above records today demonstrates: echo and stress test from 2016,  Monitor from 2016 reveals paroxysmal afib   ASSESSMENT AND PLAN:  1.  Paroxysmal atrial fibrillation The patient has symptomatic recurrent paroxysmal atrial fibrillation.  He has ongoing episodes despite medical therapy with multaq. Therapeutic strategies for afib including medicine and ablation were discussed in detail with the patient today. Risk, benefits, and alternatives to EP study and radiofrequency ablation for afib were also discussed in detail today. These risks include but are not limited to stroke, bleeding, vascular damage, tamponade, perforation, damage to the esophagus, lungs, and other structures, pulmonary vein stenosis, worsening renal function, and death. The patient understands these risk and wishes to proceed.  We will therefore proceed with catheter ablation once the patient has been adequately anticoagulated.  Will start xarelto 20mg  daily today.  chads2vasc score is 0. Echo Cardiac CT is ordered to evaluate SOB and also exclude LAA thrombus prior to ablation. ETOh cessation is encouraged  2.  SOB Echo, cardiac CT as above  Current medicines are reviewed at length with the patient today.   The patient does not have concerns regarding his medicines.  The following changes were made today:  none  Labs/ tests ordered today include:  Orders Placed This Encounter  Procedures  . CT CARDIAC MORPH/PULM VEIN W/CM&W/O CA SCORE  . Basic metabolic panel  . CBC with Differential  . EKG 12-Lead  . ECHOCARDIOGRAM COMPLETE     Signed, Thompson Grayer, MD  12/28/2016 3:43 PM     [place] [address] [phone] (office) [phone] (fax)

## 2017-01-22 NOTE — Anesthesia Postprocedure Evaluation (Signed)
Anesthesia Post Note  Patient: Ricardo [name]  Procedure(s) Performed: Procedure(s) (LRB): Atrial Fibrillation Ablation (N/A)  Patient location during evaluation: PACU Anesthesia Type: MAC Level of consciousness: awake and alert Pain management: pain level controlled Vital Signs Assessment: post-procedure vital signs reviewed and stable Respiratory status: spontaneous breathing, nonlabored ventilation and respiratory function stable Cardiovascular status: stable and blood pressure returned to baseline Anesthetic complications: no       Last Vitals:  Vitals:   01/22/17 1150 01/22/17 1205  BP: (!) 131/99 (!) 138/97  Pulse: 65 66  Resp: 10 19  Temp:      Last Pain: There were no vitals filed for this visit.               Perley Arthurs,W. EDMOND

## 2017-01-22 NOTE — Interval H&P Note (Signed)
History and Physical Interval Note:  01/22/2017 7:21 AM  Ricardo [name]  has presented today for surgery, with the diagnosis of afib  The various methods of treatment have been discussed with the patient and family. After consideration of risks, benefits and other options for treatment, the patient has consented to  Procedure(s): Atrial Fibrillation Ablation (N/A) as a surgical intervention .  The patient's history has been reviewed, patient examined, no change in status, stable for surgery.  I have reviewed the patient's chart and labs.  Questions were answered to the patient's satisfaction.     Cardiac CT and echo are reviewed.  Pt reports compliance with xarelto without interruption.  All questions answered.    Thompson Grayer

## 2017-01-22 NOTE — Discharge Instructions (Signed)
No driving for 3 days. No lifting over 5 lbs for 1 week. No vigorous or sexual activity for 1 week. You may return to work on 01/29/17. Keep procedure site clean & dry. If you notice increased pain, swelling, bleeding or pus, call/return!  You may shower, but no soaking baths/hot tubs/pools for 1 week.

## 2017-01-22 NOTE — Anesthesia Preprocedure Evaluation (Signed)
Anesthesia Evaluation  Patient identified by MRN, date of birth, ID band Patient awake    Reviewed: Allergy & Precautions, NPO status , Patient's Chart, lab work & pertinent test results  Airway Mallampati: II  TM Distance: >3 FB Neck ROM: Full    Dental no notable dental hx.    Pulmonary neg pulmonary ROS, former smoker,    Pulmonary exam normal breath sounds clear to auscultation       Cardiovascular + dysrhythmias Atrial Fibrillation  Rhythm:Irregular Rate:Normal     Neuro/Psych negative neurological ROS  negative psych ROS   GI/Hepatic negative GI ROS, Neg liver ROS,   Endo/Other  negative endocrine ROS  Renal/GU negative Renal ROS  negative genitourinary   Musculoskeletal negative musculoskeletal ROS (+)   Abdominal   Peds negative pediatric ROS (+)  Hematology negative hematology ROS (+)   Anesthesia Other Findings   Reproductive/Obstetrics negative OB ROS                             Anesthesia Physical Anesthesia Plan  ASA: III  Anesthesia Plan: General   Post-op Pain Management:    Induction: Intravenous  Airway Management Planned: LMA  Additional Equipment:   Intra-op Plan:   Post-operative Plan:   Informed Consent: I have reviewed the patients History and Physical, chart, labs and discussed the procedure including the risks, benefits and alternatives for the proposed anesthesia with the patient or authorized representative who has indicated his/her understanding and acceptance.   Dental advisory given  Plan Discussed with: CRNA and Surgeon  Anesthesia Plan Comments:         Anesthesia Quick Evaluation

## 2017-01-22 NOTE — Discharge Summary (Signed)
ELECTROPHYSIOLOGY PROCEDURE DISCHARGE SUMMARY    Patient ID: Ricardo [name],  MRN: [id number], DOB/AGE: 1956/05/27 61 y.o.  Admit date: 01/22/2017 Discharge date: 01/22/2017  Primary Care Physician: Leda Quail, MD  Electrophysiologist: Thompson Grayer, MD  Primary Discharge Diagnosis:  1. Paroxysmal Afib     CHA2DS2Vasc is zero, on Xarelto peri-procedure    Procedures This Admission:  1.  Electrophysiology study and radiofrequency catheter ablation on 01/22/17 by Dr Thompson Grayer.   This study demonstrated  PREPROCEDURE DIAGNOSES: 1. Paroxysmal atrial fibrillation. POSTPROCEDURE DIAGNOSES: 1. Paroxysmal  atrial fibrillation. 2. Typical appearing atrial flutter   CONCLUSIONS: 1. Sinus rhythm upon presentation.   2. Intracardiac echo reveals a moderate sized left atrium with five separate pulmonary veins without evidence of pulmonary vein stenosis (a small RMPV was noted). 3. Successful electrical isolation and anatomical encircling of all pulmonary veins with radiofrequency current.    4. Cavo-tricuspid isthmus ablation was performed  5. No early apparent complications.  Brief HPI: KUNAAL MALAVE is a 61 y.o. male with a history of paroxysmal atrial fibrillation.  They have failed medical therapy with Multaq. Risks, benefits, and alternatives to catheter ablation of atrial fibrillation were reviewed with the patient who wished to proceed.  The patient underwent cardiac CT prior to the procedure which demonstrated no LAA thrombus.    Hospital Course:  The patient was admitted and underwent EPS/RFCA of atrial fibrillation with details as outlined above.  He was monitored on telemetry following the procedure which demonstrated NSR.  Groin was without complication on the day of discharge.  The patient was examined by Dr. Rayann Heman and considered to be stable for discharge.  Wound care and restrictions were reviewed with the patient.  The patient will be seen back by Roderic Palau, NP in 4 weeks and Dr Rayann Heman in 12 weeks for post ablation follow up.    Physical Exam: Vitals:   01/22/17 1420 01/22/17 1500 01/22/17 1600 01/22/17 1700  BP: 131/90 (!) 121/92 132/81 128/87  Pulse: 73 66 68 67  Resp: (!) 26 14 11 14   Temp:  97.6 F (36.4 C)    TempSrc:  Oral    SpO2: 98% 93% 95% 98%    GEN- The patient is well-appearing, alert and oriented x 3 today.   HEENT: normocephalic, atraumatic; sclera clear, conjunctiva pink; hearing intact; oropharynx clear; neck supple  Lungs- Normal work of breathing.  No wheezes, rales, rhonchi Heart- RRR, no murmurs, rubs or gallops  GI- soft, non-tender, non-distended Extremities- no clubbing, cyanosis, or edema;  DP/PT/radial pulses 2+ bilaterally, groin without hematoma/bruit MS- no significant deformity or atrophy Skin- warm and dry, no rash or lesion Psych- euthymic mood, full affect Neuro- strength and sensation are intact  Limited echo by Dr Rayann Heman prior to discharge revealed no pericardial effusion.  Labs:   Lab Results  Component Value Date   WBC 4.8 01/08/2017   HGB 15.1 11/03/2013   HCT 45.0 01/08/2017   MCV 96 01/08/2017   PLT 175 01/08/2017   No results for input(s): NA, K, CL, CO2, BUN, CREATININE, CALCIUM, PROT, BILITOT, ALKPHOS, ALT, AST, GLUCOSE in the last 168 hours.  Invalid input(s): LABALBU   Discharge Medications:  Allergies as of 01/22/2017   No Known Allergies     Medication List    STOP taking these medications   ACID REDUCER PO   aspirin 81 MG tablet   MULTAQ 400 MG tablet Generic drug:  dronedarone     TAKE these  medications   hydrocortisone 2.5 % rectal cream Commonly known as:  ANUSOL-HC Place 1 application rectally 2 (two) times daily. For ten days. What changed:  when to take this  reasons to take this  additional instructions   multivitamin with minerals Tabs tablet Take 1 tablet by mouth 2 (two) times daily. Reported on 01/12/2016   OSTEO BI-FLEX ADV DOUBLE ST  PO Take 1 tablet by mouth daily. Reported on 01/12/2016   pantoprazole 40 MG tablet Commonly known as:  PROTONIX Take 1 tablet (40 mg total) by mouth daily.   POTASSIUM PO Take 1 tablet by mouth daily as needed (leg cramps). Only takes as needed during the summer   rivaroxaban 20 MG Tabs tablet Commonly known as:  XARELTO Take 1 tablet (20 mg total) by mouth daily with supper.   sildenafil 25 MG tablet Commonly known as:  VIAGRA Take 25 mg by mouth as needed for erectile dysfunction. Reported on 01/12/2016   VISINE OP Apply 1 drop to eye daily as needed (dry eyes).       Disposition:  Discharge Instructions    Diet - low sodium heart healthy    Complete by:  As directed    Increase activity slowly    Complete by:  As directed      Follow-up Information    [facility] up on 02/21/2017.   Specialty:  Cardiology Why:  8:30AM Contact information: [id number] mc [place] [phone]       Thompson Grayer, MD Follow up on 04/24/2017.   Specialty:  Cardiology Why:  8:00AM  Contact information: [place] [address] [phone]           Duration of Discharge Encounter: Greater than 30 minutes including physician time.  Signed, Thompson Grayer, MD 01/22/2017, 5:58 PM

## 2017-01-22 NOTE — Transfer of Care (Signed)
Immediate Anesthesia Transfer of Care Note  Patient: Ricardo [name]  Procedure(s) Performed: Procedure(s): Atrial Fibrillation Ablation (N/A)  Patient Location: Cath Lab  Anesthesia Type:MAC  Level of Consciousness: oriented, sedated and patient cooperative  Airway & Oxygen Therapy: Patient Spontanous Breathing and Patient connected to face mask oxygen  Post-op Assessment: Report given to RN, Post -op Vital signs reviewed and stable and Patient moving all extremities X 4  Post vital signs: Reviewed and stable  Last Vitals:  Vitals:   01/22/17 1028  Temp: 36.4 C    Last Pain: There were no vitals filed for this visit.       Complications: No apparent anesthesia complications

## 2017-01-23 ENCOUNTER — Telehealth (HOSPITAL_COMMUNITY): Payer: Self-pay | Admitting: *Deleted

## 2017-01-23 NOTE — Telephone Encounter (Signed)
Left message asking pt to call with report.

## 2017-01-23 NOTE — Telephone Encounter (Signed)
-----   Message from Thompson Grayer, MD sent at 01/22/2017  6:03 PM EST ----- Discharged same day post afib ablation. Please call him and make sure that he is doing ok 01/23/17 am.

## 2017-01-24 NOTE — Telephone Encounter (Signed)
Pt called back stating feeling ok some afib on and off but if he goes to sleep he is fine when he wakes up. Pt will call if problems or concerns.

## 2017-01-29 ENCOUNTER — Telehealth: Payer: Self-pay | Admitting: Internal Medicine

## 2017-01-29 NOTE — Telephone Encounter (Signed)
Walk In Pt Form-[employer] RTW paper-dropped off placed in [facility] Box.

## 2017-02-21 ENCOUNTER — Encounter (HOSPITAL_COMMUNITY): Payer: Self-pay | Admitting: Nurse Practitioner

## 2017-02-21 ENCOUNTER — Ambulatory Visit (HOSPITAL_COMMUNITY)
Admission: RE | Admit: 2017-02-21 | Discharge: 2017-02-21 | Disposition: A | Discharge status: Home / Self Care | Payer: 59 | Source: Ambulatory Visit | Attending: Nurse Practitioner | Admitting: Nurse Practitioner

## 2017-02-21 VITALS — BP 138/84 | HR 67 | Ht 67.5 in | Wt 164.0 lb

## 2017-02-21 DIAGNOSIS — I480 Paroxysmal atrial fibrillation: Secondary | ICD-10-CM | POA: Diagnosis not present

## 2017-02-21 DIAGNOSIS — Z87891 Personal history of nicotine dependence: Secondary | ICD-10-CM | POA: Insufficient documentation

## 2017-02-21 DIAGNOSIS — I4891 Unspecified atrial fibrillation: Secondary | ICD-10-CM

## 2017-02-21 DIAGNOSIS — Z79899 Other long term (current) drug therapy: Secondary | ICD-10-CM | POA: Insufficient documentation

## 2017-02-21 DIAGNOSIS — Z7901 Long term (current) use of anticoagulants: Secondary | ICD-10-CM | POA: Insufficient documentation

## 2017-02-21 NOTE — Progress Notes (Signed)
Primary Care Physician: Leda Quail, MD Referring Physician: Dr. Lyn Hollingshead Ricardo [name] is a 61 y.o. male with a h/o afib, s/p afib ablation 01/22/17, in the afib clinic for evaluation. He states that he still has 2-3 hours of afib 2-3 times a week but it feels less evident. He denies any swallowing or rt groin issues. He continues to take his xarelto without missed doses.  Today, he denies symptoms of palpitations, chest pain, shortness of breath, orthopnea, PND, lower extremity edema, dizziness, presyncope, syncope, or neurologic sequela. The patient is tolerating medications without difficulties and is otherwise without complaint today.   Past Medical History:  Diagnosis Date  . Carpal tunnel syndrome 11-03-13   bilateral numbness-worse while sleep-remains an issue  . History of kidney stones    '12 x 1 episode  . Left inguinal hernia   . Paroxysmal atrial fibrillation (HCC)   . Prostate cancer [facility])    prostate   Past Surgical History:  Procedure Laterality Date  . ATRIAL FIBRILLATION ABLATION N/A 01/22/2017   Procedure: Atrial Fibrillation Ablation;  Surgeon: Thompson Grayer, MD;  Location: [facility];  Service: Cardiovascular;  Laterality: N/A;  . COLONOSCOPY  03/2006; 07/16/2011   2007: rectal adenoma 2012:Normal  . ELBOW SURGERY     bilateral ulnar nerve release  . HERNIA REPAIR Left 11/06/13   LIH  . INGUINAL HERNIA REPAIR Left 11/06/2013   Procedure: HERNIA REPAIR INGUINAL ADULT;  Surgeon: Odis Hollingshead, MD;  [facility];  Service: General;  Laterality: Left;  . INSERTION OF MESH Left 11/06/2013   Procedure: INSERTION OF MESH;  Surgeon: Odis Hollingshead, MD;  [facility];  Service: General;  Laterality: Left;  . KNEE ARTHROSCOPY     left  . PROSTATE BIOPSY  05/04/2011   adenocarcinoma Gleason 6  . ROBOT ASSISTED LAPAROSCOPIC RADICAL PROSTATECTOMY  09/24/2011   Procedure: ROBOTIC ASSISTED LAPAROSCOPIC RADICAL PROSTATECTOMY LEVEL 1;  Surgeon:  Dutch Gray, MD;  [facility];  Service: Urology;  Laterality: N/A;    Current Outpatient Prescriptions  Medication Sig Dispense Refill  . hydrocortisone (ANUSOL-HC) 2.5 % rectal cream Place 1 application rectally 2 (two) times daily. For ten days. (Patient taking differently: Place 1 application rectally as needed for hemorrhoids. ) 30 g 1  . Misc Natural Products (OSTEO BI-FLEX ADV DOUBLE ST PO) Take 1 tablet by mouth daily. Reported on 01/12/2016    . Multiple Vitamin (MULTIVITAMIN WITH MINERALS) TABS tablet Take 1 tablet by mouth 2 (two) times daily. Reported on 01/12/2016    . pantoprazole (PROTONIX) 40 MG tablet Take 1 tablet (40 mg total) by mouth daily. 45 tablet 0  . POTASSIUM PO Take 1 tablet by mouth daily as needed (leg cramps). Only takes as needed during the summer    . rivaroxaban (XARELTO) 20 MG TABS tablet Take 1 tablet (20 mg total) by mouth daily with supper. 30 tablet 11  . sildenafil (VIAGRA) 25 MG tablet Take 25 mg by mouth as needed for erectile dysfunction. Reported on 01/12/2016    . Tetrahydrozoline HCl (VISINE OP) Apply 1 drop to eye daily as needed (dry eyes).     No current facility-administered medications for this encounter.     No Known Allergies  Social History   Social History  . Marital status: Divorced    Spouse name: N/A  . Number of children: N/A  . Years of education: N/A   Occupational History  . Not on file.  Social History Main Topics  . Smoking status: Former Smoker    Quit date: 09/17/2001  . Smokeless tobacco: Never Used  . Alcohol use 7.2 oz/week    5 Glasses of wine, 7 Cans of beer per week     Comment: daily  . Drug use: No  . Sexual activity: Yes   Other Topics Concern  . 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 Prostate cancer Father     ROS- All systems are reviewed and negative except as per the HPI  above  Physical Exam: Vitals:   02/21/17 0833  BP: 138/84  Pulse: 67  Weight: 164 lb (74.4 kg)  Height: 5' 7.5" (1.715 m)   Wt Readings from Last 3 Encounters:  02/21/17 164 lb (74.4 kg)  12/28/16 161 lb 12.8 oz (73.4 kg)  01/12/16 152 lb (68.9 kg)    Labs: Lab Results  Component Value Date   NA 140 01/08/2017   K 4.1 01/08/2017   CL 101 01/08/2017   CO2 22 01/08/2017   GLUCOSE 98 01/08/2017   BUN 16 01/08/2017   CREATININE 1.00 01/08/2017   CALCIUM 9.2 01/08/2017   Lab Results  Component Value Date   INR 0.99 11/03/2013   No results found for: CHOL, HDL, LDLCALC, TRIG   GEN- The patient is well appearing, alert and oriented x 3 today.   Head- normocephalic, atraumatic Eyes-  Sclera clear, conjunctiva pink Ears- hearing intact Oropharynx- clear Neck- supple, no JVP Lymph- no cervical lymphadenopathy Lungs- Clear to ausculation bilaterally, normal work of breathing Heart- Regular rate and rhythm, no murmurs, rubs or gallops, PMI not laterally displaced GI- soft, NT, ND, + BS Extremities- no clubbing, cyanosis, or edema MS- no significant deformity or atrophy Skin- no rash or lesion Psych- euthymic mood, full affect Neuro- strength and sensation are intact  EKG- NSR at 67 bpm, pr int 136 ms, qrs int 94 ms, qtc 429 ms Epic records reviewed   Assessment and Plan: 1.Paroxysmal atrial fibrillation S/p ablation with some ongoing afib but with less intensity Reassured that the frequency should diminish as the recovery period continues Continue xarelto 20 mg a day without missed doses  F/u with Dr. Rayann Heman as scheduled  6/6  Geroge Baseman. Ariann Khaimov, [facility] [address] [place], [place] [address] [phone]

## 2017-04-24 ENCOUNTER — Ambulatory Visit (INDEPENDENT_AMBULATORY_CARE_PROVIDER_SITE_OTHER): Payer: 59 | Admitting: Internal Medicine

## 2017-04-24 ENCOUNTER — Encounter: Payer: Self-pay | Admitting: Internal Medicine

## 2017-04-24 VITALS — BP 130/90 | HR 78 | Ht 67.5 in | Wt 163.8 lb

## 2017-04-24 DIAGNOSIS — I4891 Unspecified atrial fibrillation: Secondary | ICD-10-CM

## 2017-04-24 NOTE — Progress Notes (Signed)
PCP: Leda Quail., MD  Ricardo [name] is a 61 y.o. male who presents today for routine electrophysiology followup.  Since his recent afib ablation, the patient reports doing very well.  he denies procedure related complications and is pleased with the results of the procedure.  He has had some ERAF but mostly in the setting of heavy ETOH.  He also gets close to 60 hours of overtime per week and feels that he is working too hard.  Today, he denies symptoms of palpitations, chest pain, shortness of breath,  lower extremity edema, dizziness, presyncope, or syncope.  The patient is otherwise without complaint today.   Past Medical History:  Diagnosis Date  . Carpal tunnel syndrome 11-03-13   bilateral numbness-worse while sleep-remains an issue  . History of kidney stones    '12 x 1 episode  . Left inguinal hernia   . Paroxysmal atrial fibrillation (HCC)   . Prostate cancer [facility])    prostate   Past Surgical History:  Procedure Laterality Date  . ATRIAL FIBRILLATION ABLATION N/A 01/22/2017   Procedure: Atrial Fibrillation Ablation;  Surgeon: Thompson Grayer, MD;  Location: [facility];  Service: Cardiovascular;  Laterality: N/A;  . COLONOSCOPY  03/2006; 07/16/2011   2007: rectal adenoma 2012:Normal  . ELBOW SURGERY     bilateral ulnar nerve release  . HERNIA REPAIR Left 11/06/13   LIH  . INGUINAL HERNIA REPAIR Left 11/06/2013   Procedure: HERNIA REPAIR INGUINAL ADULT;  Surgeon: Odis Hollingshead, MD;  [facility];  Service: General;  Laterality: Left;  . INSERTION OF MESH Left 11/06/2013   Procedure: INSERTION OF MESH;  Surgeon: Odis Hollingshead, MD;  [facility];  Service: General;  Laterality: Left;  . KNEE ARTHROSCOPY     left  . PROSTATE BIOPSY  05/04/2011   adenocarcinoma Gleason 6  . ROBOT ASSISTED LAPAROSCOPIC RADICAL PROSTATECTOMY  09/24/2011   Procedure: ROBOTIC ASSISTED LAPAROSCOPIC RADICAL PROSTATECTOMY LEVEL 1;  Surgeon: Dutch Gray, MD;  [facility];  Service: Urology;  Laterality: N/A;    ROS- all systems are personally reviewed and negatives except as per HPI above  Current Outpatient Prescriptions  Medication Sig Dispense Refill  . Calcium Carbonate Antacid (ANTACID PO) Take 1 tablet by mouth daily as needed (heartburn).    . hydrocortisone (ANUSOL-HC) 2.5 % rectal cream Place 1 application rectally 2 (two) times daily. For ten days. 30 g 1  . Multiple Vitamin (MULTIVITAMIN WITH MINERALS) TABS tablet Take 1 tablet by mouth 2 (two) times daily. Reported on 01/12/2016    . POTASSIUM PO Take 1 tablet by mouth daily as needed (leg cramps). Only takes as needed during the summer    . rivaroxaban (XARELTO) 20 MG TABS tablet Take 1 tablet (20 mg total) by mouth daily with supper. 30 tablet 11  . sildenafil (VIAGRA) 25 MG tablet Take 25 mg by mouth daily as needed for erectile dysfunction. Reported on 01/12/2016    . Tetrahydrozoline HCl (VISINE OP) Apply 1 drop to eye daily as needed (dry eyes).     No current facility-administered medications for this visit.     Physical Exam: Vitals:   04/24/17 0805  BP: 130/90  Pulse: 78  SpO2: 95%  Weight: 163 lb 12.8 oz (74.3 kg)  Height: 5' 7.5" (1.715 m)    GEN- The patient is well appearing, alert and oriented x 3 today.   Head- normocephalic, atraumatic Eyes-  Sclera clear, conjunctiva pink Ears- hearing  intact Oropharynx- clear Lungs- Clear to ausculation bilaterally, normal work of breathing Heart- Regular rate and rhythm, no murmurs, rubs or gallops, PMI not laterally displaced GI- soft, NT, ND, + BS Extremities- no clubbing, cyanosis, or edema  EKG tracing ordered today is personally reviewed and shows sinus rhythm 78 bpm  Assessment and Plan:  1. Persistent/ paroxysmal atrial fibrillation Doing well s/p ablation chads2vasc score is 0 Stop xarelto  2. ETOH Avoidance encouraged   Return to see me in 3 months  Thompson Grayer MD, [facility] 04/24/2017 8:14 AM

## 2017-04-24 NOTE — Patient Instructions (Signed)
Medication Instructions:  Your physician has recommended you make the following change in your medication:  1) Stop Xarelto   Labwork: None ordered   Testing/Procedures: None ordered   Follow-Up: Your physician recommends that you schedule a follow-up appointment in: 3 months with Dr Allred   Any Other Special Instructions Will Be Listed Below (If Applicable).     If you need a refill on your cardiac medications before your next appointment, please call your pharmacy.   

## 2017-07-12 ENCOUNTER — Encounter: Payer: Self-pay | Admitting: Internal Medicine

## 2017-08-01 ENCOUNTER — Ambulatory Visit (INDEPENDENT_AMBULATORY_CARE_PROVIDER_SITE_OTHER): Payer: 59 | Admitting: Internal Medicine

## 2017-08-01 VITALS — BP 134/80 | HR 91 | Ht 68.0 in | Wt 162.2 lb

## 2017-08-01 DIAGNOSIS — I4891 Unspecified atrial fibrillation: Secondary | ICD-10-CM | POA: Diagnosis not present

## 2017-08-01 NOTE — Progress Notes (Signed)
PCP: Leda Quail., MD  Ricardo [name] is a 61 y.o. male who presents today for routine electrophysiology followup.  Since last being seen in our clinic, the patient reports doing very well.  Rare palpitations.  + transient dizziness.  Overall improved since af ablation. Today, he denies symptoms of chest pain, shortness of breath,  lower extremity edema,  presyncope, or syncope.  The patient is otherwise without complaint today.   Past Medical History:  Diagnosis Date  . Carpal tunnel syndrome 11-03-13   bilateral numbness-worse while sleep-remains an issue  . History of kidney stones    '12 x 1 episode  . Left inguinal hernia   . Paroxysmal atrial fibrillation (HCC)   . Prostate cancer [facility])    prostate   Past Surgical History:  Procedure Laterality Date  . ATRIAL FIBRILLATION ABLATION N/A 01/22/2017   Procedure: Atrial Fibrillation Ablation;  Surgeon: Thompson Grayer, MD;  Location: [facility];  Service: Cardiovascular;  Laterality: N/A;  . COLONOSCOPY  03/2006; 07/16/2011   2007: rectal adenoma 2012:Normal  . ELBOW SURGERY     bilateral ulnar nerve release  . HERNIA REPAIR Left 11/06/13   LIH  . INGUINAL HERNIA REPAIR Left 11/06/2013   Procedure: HERNIA REPAIR INGUINAL ADULT;  Surgeon: Odis Hollingshead, MD;  [facility];  Service: General;  Laterality: Left;  . INSERTION OF MESH Left 11/06/2013   Procedure: INSERTION OF MESH;  Surgeon: Odis Hollingshead, MD;  [facility];  Service: General;  Laterality: Left;  . KNEE ARTHROSCOPY     left  . PROSTATE BIOPSY  05/04/2011   adenocarcinoma Gleason 6  . ROBOT ASSISTED LAPAROSCOPIC RADICAL PROSTATECTOMY  09/24/2011   Procedure: ROBOTIC ASSISTED LAPAROSCOPIC RADICAL PROSTATECTOMY LEVEL 1;  Surgeon: Dutch Gray, MD;  [facility];  Service: Urology;  Laterality: N/A;    ROS- all systems are reviewed and negatives except as per HPI above  Current Outpatient Prescriptions  Medication Sig Dispense Refill    . Calcium Carbonate Antacid (ANTACID PO) Take 1 tablet by mouth daily as needed (heartburn).    . hydrocortisone (ANUSOL-HC) 2.5 % rectal cream Place 1 application rectally 2 (two) times daily. For ten days. 30 g 1  . Multiple Vitamin (MULTIVITAMIN WITH MINERALS) TABS tablet Take 1 tablet by mouth 2 (two) times daily. Reported on 01/12/2016    . POTASSIUM PO Take 1 tablet by mouth daily as needed (leg cramps). Only takes as needed during the summer    . sildenafil (VIAGRA) 25 MG tablet Take 25 mg by mouth daily as needed for erectile dysfunction. Reported on 01/12/2016    . Tetrahydrozoline HCl (VISINE OP) Apply 1 drop to eye daily as needed (dry eyes).     No current facility-administered medications for this visit.     Physical Exam: Vitals:   08/01/17 0816  BP: 134/80  Pulse: 91  SpO2: 97%  Weight: 162 lb 3.2 oz (73.6 kg)  Height: 5\' 8"  (1.727 m)    GEN- The patient is well appearing, alert and oriented x 3 today.   Head- normocephalic, atraumatic Eyes-  Sclera clear, conjunctiva pink Ears- hearing intact Oropharynx- clear Lungs- Clear to ausculation bilaterally, normal work of breathing Heart- Regular rate and rhythm, no murmurs, rubs or gallops, PMI not laterally displaced GI- soft, NT, ND, + BS Extremities- no clubbing, cyanosis, or edema  EKG tracing ordered today is personally reviewed and shows sinus rhythm with PACs, rare PVCs  Assessment and Plan:  1. Paroxysmal atrial fibrillation Doing well s/p ablation off AAD therapy chads2vasc score is 0.  No anticoagualation Could consider ILR if palpitations increase. (I suspect these are due to PACs)  2. ETOH  Avoidance encouraged  Return in 3 months  Thompson Grayer MD, [facility] 08/01/2017 8:31 AM

## 2017-08-01 NOTE — Patient Instructions (Signed)
Medication Instructions:  Your physician recommends that you continue on your current medications as directed. Please refer to the Current Medication list given to you today.   Labwork: None ordered   Testing/Procedures: None ordered  Call [phone] if you decide to proceed with LINQ   Follow-Up: Your physician recommends that you schedule a follow-up appointment in: 3 months with Dr Rayann Heman    Any Other Special Instructions Will Be Listed Below (If Applicable).     If you need a refill on your cardiac medications before your next appointment, please call your pharmacy.

## 2017-10-30 ENCOUNTER — Ambulatory Visit (INDEPENDENT_AMBULATORY_CARE_PROVIDER_SITE_OTHER): Payer: 59 | Admitting: Internal Medicine

## 2017-10-30 ENCOUNTER — Encounter: Payer: Self-pay | Admitting: Internal Medicine

## 2017-10-30 VITALS — BP 142/84 | HR 64 | Ht 68.0 in | Wt 162.0 lb

## 2017-10-30 DIAGNOSIS — I4891 Unspecified atrial fibrillation: Secondary | ICD-10-CM | POA: Diagnosis not present

## 2017-10-30 DIAGNOSIS — R002 Palpitations: Secondary | ICD-10-CM

## 2017-10-30 NOTE — H&P (View-Only) (Signed)
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  Since last being seen in our clinic, the patient reports doing very well.  He has some palpitations of unclear etiology.  Today, he denies symptoms of chest pain, shortness of breath,  lower extremity edema, dizziness, presyncope, or syncope.  The patient is otherwise without complaint today.   Past Medical History:  Diagnosis Date  . Carpal tunnel syndrome 11-03-13   bilateral numbness-worse while sleep-remains an issue  . History of kidney stones    '12 x 1 episode  . Left inguinal hernia   . Paroxysmal atrial fibrillation (HCC)   . Prostate cancer [facility])    prostate   Past Surgical History:  Procedure Laterality Date  . ATRIAL FIBRILLATION ABLATION N/A 01/22/2017   Procedure: Atrial Fibrillation Ablation;  Surgeon: Thompson Grayer, MD;  Location: [facility];  Service: Cardiovascular;  Laterality: N/A;  . COLONOSCOPY  03/2006; 07/16/2011   2007: rectal adenoma 2012:Normal  . ELBOW SURGERY     bilateral ulnar nerve release  . HERNIA REPAIR Left 11/06/13   LIH  . INGUINAL HERNIA REPAIR Left 11/06/2013   Procedure: HERNIA REPAIR INGUINAL ADULT;  Surgeon: Odis Hollingshead, MD;  Location: [facility];  Service: General;  Laterality: Left;  . INSERTION OF MESH Left 11/06/2013   Procedure: INSERTION OF MESH;  Surgeon: Odis Hollingshead, MD;  Location: [facility];  Service: General;  Laterality: Left;  . KNEE ARTHROSCOPY     left  . PROSTATE BIOPSY  05/04/2011   adenocarcinoma Gleason 6  . ROBOT ASSISTED LAPAROSCOPIC RADICAL PROSTATECTOMY  09/24/2011   Procedure: ROBOTIC ASSISTED LAPAROSCOPIC RADICAL PROSTATECTOMY LEVEL 1;  Surgeon: Dutch Gray, MD;  Location: [facility];  Service: Urology;  Laterality: N/A;    ROS- all systems are reviewed and negatives except as per HPI above  Current Outpatient Medications  Medication Sig Dispense Refill    . Calcium Carbonate Antacid (ANTACID PO) Take 1 tablet by mouth daily as needed (heartburn).    . hydrocortisone (ANUSOL-HC) 2.5 % rectal cream Place 1 application rectally 2 (two) times daily. For ten days. 30 g 1  . hydrocortisone (ANUSOL-HC) 2.5 % rectal cream Place 1 application rectally 2 (two) times daily as needed for anal itching (as directed).    . Multiple Vitamin (MULTIVITAMIN WITH MINERALS) TABS tablet Take 1 tablet by mouth 2 (two) times daily. Reported on 01/12/2016    . POTASSIUM PO Take 1 tablet by mouth daily as needed (leg cramps). Only takes as needed during the summer    . sildenafil (VIAGRA) 25 MG tablet Take 25 mg by mouth daily as needed for erectile dysfunction. Reported on 01/12/2016    . Tetrahydrozoline HCl (VISINE OP) Apply 1 drop to eye daily as needed (dry eyes).     No current facility-administered medications for this visit.     Physical Exam: Vitals:   10/30/17 1200  BP: (!) 142/84  Pulse: 64  SpO2: 98%  Weight: 162 lb (73.5 kg)  Height: 5\' 8"  (1.727 m)    GEN- The patient is well appearing, alert and oriented x 3 today.   Head- normocephalic, atraumatic Eyes-  Sclera clear, conjunctiva pink Ears- hearing intact Oropharynx- clear Lungs- Clear to ausculation bilaterally, normal work of breathing Heart- Regular rate and rhythm, no murmurs, rubs or gallops, PMI not laterally displaced GI- soft, NT, ND, + BS  Extremities- no clubbing, cyanosis, or edema  EKG tracing ordered today is personally reviewed and shows sinus rhythm 64 bpm, PR 140 msec, QRS 92 msec, Qtc 410 msec, otherwise normal ekg  Assessment and Plan:  1. Paroxysmal atrial fibrillation Doing well s/p ablation off AAD therapy He has some palpitations of unclear etiology.  He is concerned that this may be afib.  He is very interested in ILR implantation for long term monitoring post ablation.  I agree that ILR can be helpful to evaluate his palpitations and for afib management post ablation.   Risks and benefits of the procedure were discussed with the patient who wishes to proceed.  2. ETOH Avoidance encouraged  Return to see me in 3 months  Thompson Grayer MD, [facility] 10/30/2017 12:18 PM

## 2017-10-30 NOTE — Progress Notes (Signed)
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  Since last being seen in our clinic, the patient reports doing very well.  He has some palpitations of unclear etiology.  Today, he denies symptoms of chest pain, shortness of breath,  lower extremity edema, dizziness, presyncope, or syncope.  The patient is otherwise without complaint today.   Past Medical History:  Diagnosis Date  . Carpal tunnel syndrome 11-03-13   bilateral numbness-worse while sleep-remains an issue  . History of kidney stones    '12 x 1 episode  . Left inguinal hernia   . Paroxysmal atrial fibrillation (HCC)   . Prostate cancer [facility])    prostate   Past Surgical History:  Procedure Laterality Date  . ATRIAL FIBRILLATION ABLATION N/A 01/22/2017   Procedure: Atrial Fibrillation Ablation;  Surgeon: Thompson Grayer, MD;  Location: [facility];  Service: Cardiovascular;  Laterality: N/A;  . COLONOSCOPY  03/2006; 07/16/2011   2007: rectal adenoma 2012:Normal  . ELBOW SURGERY     bilateral ulnar nerve release  . HERNIA REPAIR Left 11/06/13   LIH  . INGUINAL HERNIA REPAIR Left 11/06/2013   Procedure: HERNIA REPAIR INGUINAL ADULT;  Surgeon: Odis Hollingshead, MD;  Location: [facility];  Service: General;  Laterality: Left;  . INSERTION OF MESH Left 11/06/2013   Procedure: INSERTION OF MESH;  Surgeon: Odis Hollingshead, MD;  Location: [facility];  Service: General;  Laterality: Left;  . KNEE ARTHROSCOPY     left  . PROSTATE BIOPSY  05/04/2011   adenocarcinoma Gleason 6  . ROBOT ASSISTED LAPAROSCOPIC RADICAL PROSTATECTOMY  09/24/2011   Procedure: ROBOTIC ASSISTED LAPAROSCOPIC RADICAL PROSTATECTOMY LEVEL 1;  Surgeon: Dutch Gray, MD;  Location: [facility];  Service: Urology;  Laterality: N/A;    ROS- all systems are reviewed and negatives except as per HPI above  Current Outpatient Medications  Medication Sig Dispense Refill    . Calcium Carbonate Antacid (ANTACID PO) Take 1 tablet by mouth daily as needed (heartburn).    . hydrocortisone (ANUSOL-HC) 2.5 % rectal cream Place 1 application rectally 2 (two) times daily. For ten days. 30 g 1  . hydrocortisone (ANUSOL-HC) 2.5 % rectal cream Place 1 application rectally 2 (two) times daily as needed for anal itching (as directed).    . Multiple Vitamin (MULTIVITAMIN WITH MINERALS) TABS tablet Take 1 tablet by mouth 2 (two) times daily. Reported on 01/12/2016    . POTASSIUM PO Take 1 tablet by mouth daily as needed (leg cramps). Only takes as needed during the summer    . sildenafil (VIAGRA) 25 MG tablet Take 25 mg by mouth daily as needed for erectile dysfunction. Reported on 01/12/2016    . Tetrahydrozoline HCl (VISINE OP) Apply 1 drop to eye daily as needed (dry eyes).     No current facility-administered medications for this visit.     Physical Exam: Vitals:   10/30/17 1200  BP: (!) 142/84  Pulse: 64  SpO2: 98%  Weight: 162 lb (73.5 kg)  Height: 5\' 8"  (1.727 m)    GEN- The patient is well appearing, alert and oriented x 3 today.   Head- normocephalic, atraumatic Eyes-  Sclera clear, conjunctiva pink Ears- hearing intact Oropharynx- clear Lungs- Clear to ausculation bilaterally, normal work of breathing Heart- Regular rate and rhythm, no murmurs, rubs or gallops, PMI not laterally displaced GI- soft, NT, ND, + BS  Extremities- no clubbing, cyanosis, or edema  EKG tracing ordered today is personally reviewed and shows sinus rhythm 64 bpm, PR 140 msec, QRS 92 msec, Qtc 410 msec, otherwise normal ekg  Assessment and Plan:  1. Paroxysmal atrial fibrillation Doing well s/p ablation off AAD therapy He has some palpitations of unclear etiology.  He is concerned that this may be afib.  He is very interested in ILR implantation for long term monitoring post ablation.  I agree that ILR can be helpful to evaluate his palpitations and for afib management post ablation.   Risks and benefits of the procedure were discussed with the patient who wishes to proceed.  2. ETOH Avoidance encouraged  Return to see me in 3 months  Thompson Grayer MD, [facility] 10/30/2017 12:18 PM

## 2017-10-30 NOTE — Patient Instructions (Signed)
Medication Instructions:  Your physician recommends that you continue on your current medications as directed. Please refer to the Current Medication list given to you today.   Labwork: None ordered   Testing/Procedures: LINQ implant on 11/05/17 Please check in at 6:30am [facility]  Follow-Up: Your physician recommends that you schedule a follow-up appointment in: 10-14 days from 11/05/17 in device clinic for wound check and 3 months with Dr Rayann Heman   Any Other Special Instructions Will Be Listed Below (If Applicable).     If you need a refill on your cardiac medications before your next appointment, please call your pharmacy.

## 2017-11-05 ENCOUNTER — Encounter (HOSPITAL_COMMUNITY): Payer: Self-pay | Admitting: Internal Medicine

## 2017-11-05 ENCOUNTER — Encounter (HOSPITAL_COMMUNITY)
Admission: RE | Disposition: A | Discharge status: Home / Self Care | Payer: Self-pay | Source: Ambulatory Visit | Attending: Internal Medicine

## 2017-11-05 ENCOUNTER — Ambulatory Visit (HOSPITAL_COMMUNITY)
Admission: RE | Admit: 2017-11-05 | Discharge: 2017-11-05 | Disposition: A | Discharge status: Home / Self Care | Payer: 59 | Source: Ambulatory Visit | Attending: Internal Medicine | Admitting: Internal Medicine

## 2017-11-05 DIAGNOSIS — Z9889 Other specified postprocedural states: Secondary | ICD-10-CM | POA: Insufficient documentation

## 2017-11-05 DIAGNOSIS — Z8719 Personal history of other diseases of the digestive system: Secondary | ICD-10-CM | POA: Insufficient documentation

## 2017-11-05 DIAGNOSIS — R002 Palpitations: Secondary | ICD-10-CM | POA: Insufficient documentation

## 2017-11-05 DIAGNOSIS — R55 Syncope and collapse: Secondary | ICD-10-CM

## 2017-11-05 DIAGNOSIS — I480 Paroxysmal atrial fibrillation: Secondary | ICD-10-CM | POA: Insufficient documentation

## 2017-11-05 DIAGNOSIS — Z87442 Personal history of urinary calculi: Secondary | ICD-10-CM | POA: Diagnosis not present

## 2017-11-05 DIAGNOSIS — I4891 Unspecified atrial fibrillation: Secondary | ICD-10-CM | POA: Diagnosis not present

## 2017-11-05 DIAGNOSIS — Z8546 Personal history of malignant neoplasm of prostate: Secondary | ICD-10-CM | POA: Insufficient documentation

## 2017-11-05 DIAGNOSIS — G5600 Carpal tunnel syndrome, unspecified upper limb: Secondary | ICD-10-CM | POA: Diagnosis not present

## 2017-11-05 DIAGNOSIS — Z79899 Other long term (current) drug therapy: Secondary | ICD-10-CM | POA: Insufficient documentation

## 2017-11-05 HISTORY — PX: LOOP RECORDER INSERTION: EP1214

## 2017-11-05 SURGERY — LOOP RECORDER INSERTION
Anesthesia: LOCAL | Site: Chest | Laterality: Left

## 2017-11-05 MED ORDER — LIDOCAINE-EPINEPHRINE 1 %-1:100000 IJ SOLN
INTRAMUSCULAR | Status: AC
  Filled 2017-11-05: qty 1

## 2017-11-05 MED ORDER — LIDOCAINE-EPINEPHRINE 1 %-1:100000 IJ SOLN
INTRAMUSCULAR | Status: DC | PRN
  Administered 2017-11-05: 20 mL

## 2017-11-05 SURGICAL SUPPLY — 2 items
LOOP REVEAL LINQSYS (Prosthesis & Implant Heart) ×1 IMPLANT
PACK LOOP INSERTION (CUSTOM PROCEDURE TRAY) ×1 IMPLANT

## 2017-11-05 NOTE — Interval H&P Note (Signed)
History and Physical Interval Note:  11/05/2017 6:52 AM  Ricardo [name]  has presented today for surgery, with the diagnosis of syncope  The various methods of treatment have been discussed with the patient and family. After consideration of risks, benefits and other options for treatment, the patient has consented to  Procedure(s): LOOP RECORDER INSERTION (N/A) as a surgical intervention .  The patient's history has been reviewed, patient examined, no change in status, stable for surgery.  I have reviewed the patient's chart and labs.  Questions were answered to the patient's satisfaction.     Thompson Grayer

## 2017-11-05 NOTE — Discharge Instructions (Signed)
Implantable Loop Recorder Placement An implantable loop recorder is a small electronic device that is placed under the skin of your chest. It is about the size of an AA ("double A") battery. The device records the electrical activity of your heart over a long period of time. Your health care provider can download these recordings to monitor your heart. You may need an implantable loop recorder if you have periods of abnormal heart activity (arrhythmias) or unexplained fainting (syncope) caused by a heart problem. Tell a health care provider about:  Any allergies you have.  All medicines you are taking, including vitamins, herbs, eye drops, creams, and over-the-counter medicines.  Any problems you or family members have had with anesthetic medicines.  Any blood disorders you have.  Any surgeries you have had.  Any medical conditions you have.  Whether you are pregnant or may be pregnant. What are the risks? Generally, this is a safe procedure. However, as with any procedure, problems may occur, including:  Infection.  Bleeding.  Allergic reactions to anesthetic medicines.  Damage to nerves or blood vessels.  Failure of the device to work. This could require another surgery to replace it.  What happens before the procedure?   You may have a physical exam, blood tests, and imaging tests of your heart, such as a chest X-ray.  Follow instructions from your health care provider about eating or drinking restrictions.  Ask your health care provider about: ? Changing or stopping your regular medicines. This is especially important if you are taking diabetes medicines or blood thinners. ? Taking medicines such as aspirin and ibuprofen. These medicines can thin your blood. Do not take these medicines before your procedure if your surgeon instructs you not to.  Ask your health care provider how your surgical site will be marked or identified.  You may be given antibiotic medicine  to help prevent infection.  Plan to have someone take you home after the procedure.  If you will be going home right after the procedure, plan to have someone with you for 24 hours.  Do not use any tobacco products, such as cigarettes, chewing tobacco, and e-cigarettes as told by your surgeon. If you need help quitting, ask your health care provider. What happens during the procedure?  To reduce your risk of infection: ? Your health care team will wash or sanitize their hands. ? Your skin will be washed with soap.  An IV tube will be inserted into one of your veins.  You may be given an antibiotic medicine through the IV tube.  You may be given one or more of the following: ? A medicine to help you relax (sedative). ? A medicine to numb the area (local anesthetic).  A small cut (incision) will be made on the left side of your upper chest.  A pocket will be created under your skin.  The device will be placed in the pocket.  The incision will be closed with stitches (sutures) or adhesive strips.  A bandage (dressing) will be placed over the incision. The procedure may vary among health care providers and hospitals. What happens after the procedure?  Your blood pressure, heart rate, breathing rate, and blood oxygen level will be monitored often until the medicines you were given have worn off.  You may be able to go home on the day of your surgery. Before going home: ? Your health care provider will program your recorder. ? You will learn how to trigger your device with  a handheld activator. ? You will learn how to send recordings to your health care provider. ? You will get an ID card for your device, and you will be told when to use it.  Do not drive for 24 hours if you received a sedative. This information is not intended to replace advice given to you by your health care provider. Make sure you discuss any questions you have with your health care provider. Document  Released: 10/17/2015 Document Revised: 04/12/2016 Document Reviewed: 08/10/2015 Elsevier Interactive Patient Education  2018 [place] Placement, Care After Refer to this sheet in the next few weeks. These instructions provide you with information about caring for yourself after your procedure. Your health care provider may also give you more specific instructions. Your treatment has been planned according to current medical practices, but problems sometimes occur. Call your health care provider if you have any problems or questions after your procedure. What can I expect after the procedure? After the procedure, it is common to have:  Soreness or pain near the cut from surgery (incision).  Some swelling or bruising near the incision.  Follow these instructions at home: Medicines  Take over-the-counter and prescription medicines only as told by your health care provider.  If you were prescribed an antibiotic medicine, take it as told by your health care provider. Do not stop taking the antibiotic even if you start to feel better. Bathing  Do not take baths, swim, or use a hot tub until your health care provider approves. Ask your health care provider if you can take showers. You may only be allowed to take sponge baths for bathing. Incision care  Follow instructions from your health care provider about how to take care of your incision. Make sure you: ? Wash your hands with soap and water before you change your bandage (dressing). If soap and water are not available, use hand sanitizer. ? Change your dressing as told by your health care provider. ? Keep your dressing dry. ? Leave stitches (sutures), skin glue, or adhesive strips in place. These skin closures may need to stay in place for 2 weeks or longer. If adhesive strip edges start to loosen and curl up, you may trim the loose edges. Do not remove adhesive strips completely unless your health care provider  tells you to do that.  Check your incision area every day for signs of infection. Check for: ? More redness, swelling, or pain. ? Fluid or blood. ? Warmth. ? Pus or a bad smell. Driving  If you received a sedative, do not drive for 24 hours after the procedure.  If you did not receive a sedative, ask your health care provider when it is safe to drive. Activity  Return to your normal activities as told by your health care provider. Ask your health care provider what activities are safe for you.  Until your health care provider says it is safe: ? Do not lift anything that is heavier than 10 lb (4.5 kg). ? Do not do activities that involve lifting your arms over your head. General instructions   Follow instructions from your health care provider about how and when to use your implantable loop recorder.  Do not go through a metal detection gate, and do not let someone hold a metal detector over your chest. Show your ID card.  Do not have an MRI unless you check with your health care provider first.  Do not use any tobacco products, such  as cigarettes, chewing tobacco, and e-cigarettes. Tobacco can delay healing. If you need help quitting, ask your health care provider.  Keep all follow-up visits as told by your health care provider. This is important. Contact a health care provider if:  You have more redness, swelling, or pain around your incision.  You have more fluid or blood coming from your incision.  Your incision feels warm to the touch.  You have pus or a bad smell coming from your incision.  You have a fever.  You have pain that is not relieved by your pain medicine.  You have triggered your device because of fainting (syncope) or because of a heartbeat that feels like it is racing, slow, fluttering, or skipping (palpitations). Get help right away if:  You have chest pain.  You have difficulty breathing. This information is not intended to replace advice given  to you by your health care provider. Make sure you discuss any questions you have with your health care provider. Document Released: 10/17/2015 Document Revised: 04/12/2016 Document Reviewed: 08/10/2015 Elsevier Interactive Patient Education  [employer].

## 2017-11-08 ENCOUNTER — Telehealth: Payer: Self-pay | Admitting: *Deleted

## 2017-11-08 NOTE — Telephone Encounter (Signed)
LMOVM (DPR) requesting manual Carelink transmission for review and gave step-by-step instructions.  [facility] phone number for questions/concerns.

## 2017-11-11 NOTE — Telephone Encounter (Signed)
Manual transmission received on 11/11/17.  103 "AF" episodes noted by LINQ.  No tachy, pause, or brady episodes noted.  Per 08/01/17 OV note from Dr. Rayann Heman, CHADS2VASC score is 0.  Available ECGs printed for review by Dr. Rayann Heman when he is back in the office.

## 2017-11-21 ENCOUNTER — Ambulatory Visit (INDEPENDENT_AMBULATORY_CARE_PROVIDER_SITE_OTHER): Payer: Self-pay | Admitting: *Deleted

## 2017-11-21 DIAGNOSIS — I4891 Unspecified atrial fibrillation: Secondary | ICD-10-CM

## 2017-11-22 NOTE — Progress Notes (Signed)
Wound check in clinic s/p ILR implant. Steri strips removed prior to implant. Incision edges approximated without redness or edema, wound well healed. Normal ILR device function. Battery status: Good. R-waves 0.15mV. (1) symptom episode (demo post procedure), 0 tachy episodes, 0 pause episodes, 0 brady episodes. 209 AF episodes (9.6% burden); AF reprogrammed to "longest episode only"-per JA. Monthly summary reports and ROV with JA on 3/20 @ 0800.

## 2017-11-27 ENCOUNTER — Other Ambulatory Visit: Payer: Self-pay | Admitting: Internal Medicine

## 2017-12-02 LAB — CUP PACEART INCLINIC DEVICE CHECK
Implantable Pulse Generator Implant Date: 20181218
MDC IDC SESS DTM: 20190114145000

## 2017-12-05 ENCOUNTER — Ambulatory Visit (INDEPENDENT_AMBULATORY_CARE_PROVIDER_SITE_OTHER): Payer: 59 | Admitting: *Deleted

## 2017-12-05 DIAGNOSIS — I4891 Unspecified atrial fibrillation: Secondary | ICD-10-CM | POA: Diagnosis not present

## 2017-12-05 NOTE — Progress Notes (Signed)
Carelink Summary Report / Loop Recorder 

## 2017-12-06 ENCOUNTER — Telehealth: Payer: Self-pay | Admitting: *Deleted

## 2017-12-06 NOTE — Telephone Encounter (Signed)
Dr. Rayann Heman reviewed patient's AF burden on LINQ (9.6% as of 11/21/17) and recommended sooner f/u than 3/20 appt if patient symptomatic.    Spoke with patient to offer sooner appt.  He denies symptoms, including ShOB, palpitations, or chest discomfort and has not been aware of recent AF episodes.  Patient declines sooner appt at this time, but is aware to call back if he changes his mind.  He is appreciative and denies additional questions or concerns at this time.

## 2017-12-10 LAB — CUP PACEART REMOTE DEVICE CHECK
Implantable Pulse Generator Implant Date: 20181218
MDC IDC SESS DTM: 20190117134141

## 2017-12-27 ENCOUNTER — Encounter: Payer: Self-pay | Admitting: Internal Medicine

## 2018-01-06 ENCOUNTER — Ambulatory Visit (INDEPENDENT_AMBULATORY_CARE_PROVIDER_SITE_OTHER): Payer: 59 | Admitting: *Deleted

## 2018-01-06 DIAGNOSIS — I4891 Unspecified atrial fibrillation: Secondary | ICD-10-CM | POA: Diagnosis not present

## 2018-01-07 NOTE — Progress Notes (Signed)
Carelink Summary Report / Loop Recorder 

## 2018-01-29 ENCOUNTER — Telehealth: Payer: Self-pay | Admitting: Cardiology

## 2018-01-29 NOTE — Telephone Encounter (Signed)
Spoke w/ pt and requested that he send a manual transmission b/c his home monitor has not updated in at least 14 days.   

## 2018-02-03 LAB — CUP PACEART REMOTE DEVICE CHECK
Implantable Pulse Generator Implant Date: 20181218
MDC IDC SESS DTM: 20190217204326

## 2018-02-05 ENCOUNTER — Encounter: Payer: 59 | Admitting: Internal Medicine

## 2018-02-07 ENCOUNTER — Ambulatory Visit (INDEPENDENT_AMBULATORY_CARE_PROVIDER_SITE_OTHER): Payer: 59 | Admitting: *Deleted

## 2018-02-07 DIAGNOSIS — I4891 Unspecified atrial fibrillation: Secondary | ICD-10-CM

## 2018-02-10 NOTE — Progress Notes (Signed)
Carelink Summary Report / Loop Recorder 

## 2018-02-24 ENCOUNTER — Encounter: Payer: Self-pay | Admitting: Internal Medicine

## 2018-02-24 ENCOUNTER — Ambulatory Visit (INDEPENDENT_AMBULATORY_CARE_PROVIDER_SITE_OTHER): Payer: 59 | Admitting: Internal Medicine

## 2018-02-24 VITALS — BP 132/82 | HR 74 | Ht 68.0 in | Wt 164.0 lb

## 2018-02-24 DIAGNOSIS — I480 Paroxysmal atrial fibrillation: Secondary | ICD-10-CM | POA: Diagnosis not present

## 2018-02-24 DIAGNOSIS — R002 Palpitations: Secondary | ICD-10-CM

## 2018-02-24 LAB — CUP PACEART INCLINIC DEVICE CHECK
Date Time Interrogation Session: 20190408172844
MDC IDC PG IMPLANT DT: 20181218

## 2018-02-24 MED ORDER — DILTIAZEM HCL 30 MG PO TABS: 30.0000 mg | ORAL_TABLET | ORAL | 3 refills | Status: DC | PRN

## 2018-02-24 NOTE — Patient Instructions (Addendum)
Medication Instructions:  Your physician has recommended you make the following change in your medication:  1.  Take Cardizem 30 mg one to two tablets every 4 hours as needed for heart racing.  Labwork: None ordered.  Testing/Procedures: None ordered.  Follow-Up: Your physician wants you to follow-up in: 3 months with Dr. Rayann Heman.     Any Other Special Instructions Will Be Listed Below (If Applicable).  If you need a refill on your cardiac medications before your next appointment, please call your pharmacy.

## 2018-02-24 NOTE — Progress Notes (Signed)
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  Since last being seen in our clinic, the patient reports doing very well.   He has occasional afib.  His most prominent episode was 02/15/18.  AF with RVR confirmed on ILR.  He had associated SOB and CP with this episode.  Today, he denies symptoms of palpitations,  lower extremity edema, dizziness, presyncope, or syncope.  The patient is otherwise without complaint today.   Past Medical History:  Diagnosis Date  . Carpal tunnel syndrome 11-03-13   bilateral numbness-worse while sleep-remains an issue  . History of kidney stones    '12 x 1 episode  . Left inguinal hernia   . Paroxysmal atrial fibrillation (HCC)   . Prostate cancer [facility])    prostate   Past Surgical History:  Procedure Laterality Date  . ATRIAL FIBRILLATION ABLATION N/A 01/22/2017   Procedure: Atrial Fibrillation Ablation;  Surgeon: Thompson Grayer, MD;  Location: [place] [facility];  Service: Cardiovascular;  Laterality: N/A;  . COLONOSCOPY  03/2006; 07/16/2011   2007: rectal adenoma 2012:Normal  . ELBOW SURGERY     bilateral ulnar nerve release  . HERNIA REPAIR Left 11/06/13   LIH  . INGUINAL HERNIA REPAIR Left 11/06/2013   Procedure: HERNIA REPAIR INGUINAL ADULT;  Surgeon: Odis Hollingshead, MD;  [facility];  Service: General;  Laterality: Left;  . INSERTION OF MESH Left 11/06/2013   Procedure: INSERTION OF MESH;  Surgeon: Odis Hollingshead, MD;  [facility];  Service: General;  Laterality: Left;  . KNEE ARTHROSCOPY     left  . LOOP RECORDER INSERTION Left 11/05/2017   Procedure: LOOP RECORDER INSERTION;  Surgeon: Thompson Grayer, MD;  Location: [place] [facility];  Service: Cardiovascular;  Laterality: Left;  . PROSTATE BIOPSY  05/04/2011   adenocarcinoma Gleason 6  . ROBOT ASSISTED LAPAROSCOPIC RADICAL PROSTATECTOMY  09/24/2011   Procedure: ROBOTIC ASSISTED  LAPAROSCOPIC RADICAL PROSTATECTOMY LEVEL 1;  Surgeon: Dutch Gray, MD;  [facility];  Service: Urology;  Laterality: N/A;    ROS- all systems are reviewed and negatives except as per HPI above  Current Outpatient Medications  Medication Sig Dispense Refill  . calcium carbonate (TUMS - DOSED IN MG ELEMENTAL CALCIUM) 500 MG chewable tablet Chew 1 tablet by mouth as needed for indigestion or heartburn.    . Calcium Carbonate Antacid (ANTACID PO) Take 1 tablet by mouth daily as needed (heartburn).    . clotrimazole-betamethasone (LOTRISONE) cream Apply 1 application topically 2 (two) times daily as needed (for itching).   0  . hydrocortisone (ANUSOL-HC) 2.5 % rectal cream Place 1 application rectally 2 (two) times daily. For ten days. 30 g 1  . ibuprofen (ADVIL,MOTRIN) 200 MG tablet Take 800 mg by mouth every 6 (six) hours as needed for headache or moderate pain.    . Multiple Vitamin (MULTIVITAMIN WITH MINERALS) TABS tablet Take 1 tablet by mouth 2 (two) times daily.     . Polyvinyl Alcohol-Povidone (REFRESH OP) Place 1 drop into both eyes as needed (for dry eyes).    Marland Kitchen POTASSIUM PO Take 1 tablet by mouth daily as needed (for leg cramps in the summer).     . sildenafil (REVATIO) 20 MG tablet Take 20-60 mg by mouth as needed (for ED).     No current facility-administered medications for this visit.     Physical Exam:  Vitals:   02/24/18 1624  BP: 132/82  Pulse: 74  SpO2: 96%  Weight: 164 lb (74.4 kg)  Height: 5\' 8"  (1.727 m)    GEN- The patient is well appearing, alert and oriented x 3 today.   Head- normocephalic, atraumatic Eyes-  Sclera clear, conjunctiva pink Ears- hearing intact Oropharynx- clear Lungs- Clear to ausculation bilaterally, normal work of breathing Heart- Regular rate and rhythm, no murmurs, rubs or gallops, PMI not laterally displaced GI- soft, NT, ND, + BS Extremities- no clubbing, cyanosis, or edema  EKG tracing ordered today is personally reviewed and  shows sinus rhythm  Assessment and Plan:  1. Paroxysmal atrial fibrillation Recurrence of afib post ablation ILR reveals AF burden of 9% Very fast RVR with afib.  I have given cardizem 30mg  script today.  He can take 1-2 pills q4h prn. Therapeutic strategies for afib including medicine and ablation were discussed in detail with the patient today. Risk, benefits, and alternatives to EP study and radiofrequency ablation for afib were also discussed in detail today. At this time, he would prefer a conservative approach.  He will contact my office if he decides to proceed.  We will need to protocol his CTA to evaluate his Cors at that time.  Will also need to restart xarelto 3 weeks prior to ablation.  2. ETOH Avoidance encouraged  3. SOB and CP with AF with RVR He is disappointed that his prior CTA did not comment on Cors.  We will try to protocol his upcoming CTA accordingly if he proceeds with AF ablation. I have offered Exercise myoview today which he declines.  He will contact my office with any additional chest pain.  Return in 3 months  Thompson Grayer MD, [facility] 02/24/2018 4:52 PM

## 2018-03-12 ENCOUNTER — Ambulatory Visit (INDEPENDENT_AMBULATORY_CARE_PROVIDER_SITE_OTHER): Payer: 59 | Admitting: *Deleted

## 2018-03-12 DIAGNOSIS — I480 Paroxysmal atrial fibrillation: Secondary | ICD-10-CM | POA: Diagnosis not present

## 2018-03-13 ENCOUNTER — Telehealth: Payer: Self-pay | Admitting: *Deleted

## 2018-03-13 NOTE — Telephone Encounter (Signed)
Received Carelink alert for elevated V rates during AF episodes, averaging ~140-150bpm.  Will determine if patient symptomatic with episodes and if he has started taking PRN diltiazem as instructed at 02/24/18 OV with Dr. Rayann Heman.

## 2018-03-14 NOTE — Progress Notes (Signed)
Carelink Summary Report / Loop Recorder 

## 2018-03-18 LAB — CUP PACEART REMOTE DEVICE CHECK
Date Time Interrogation Session: 20190322220956
Implantable Pulse Generator Implant Date: 20181218

## 2018-03-24 NOTE — Telephone Encounter (Signed)
Spoke with patient.  He has not been aware of elevated HR with most recent AF episodes (most recent monitor update on 5/1, most recent AF episode on 4/20).  He states that the episodes have been short so he has not tried PRN diltiazem yet.  Reminded patient about the purpose of PRN diltiazem and encouraged him to call our office if he feels that it does not help with rapid HR, or if he has any concerns.  Patient verbalizes understanding and denies additional questions at this time.

## 2018-04-02 ENCOUNTER — Telehealth: Payer: Self-pay | Admitting: Cardiology

## 2018-04-02 NOTE — Telephone Encounter (Signed)
Spoke w/ pt and requested that he send a manual transmission b/c his home monitor has not updated in at least 14 days.   

## 2018-04-07 LAB — CUP PACEART REMOTE DEVICE CHECK
Date Time Interrogation Session: 20190424220722
MDC IDC PG IMPLANT DT: 20181218

## 2018-04-15 ENCOUNTER — Ambulatory Visit (INDEPENDENT_AMBULATORY_CARE_PROVIDER_SITE_OTHER): Payer: 59 | Admitting: *Deleted

## 2018-04-15 DIAGNOSIS — I480 Paroxysmal atrial fibrillation: Secondary | ICD-10-CM

## 2018-04-15 NOTE — Progress Notes (Signed)
Carelink Summary Report / Loop Recorder 

## 2018-05-07 LAB — CUP PACEART REMOTE DEVICE CHECK
Date Time Interrogation Session: 20190527223919
MDC IDC PG IMPLANT DT: 20181218

## 2018-05-19 ENCOUNTER — Ambulatory Visit (INDEPENDENT_AMBULATORY_CARE_PROVIDER_SITE_OTHER): Payer: 59 | Admitting: *Deleted

## 2018-05-19 DIAGNOSIS — I480 Paroxysmal atrial fibrillation: Secondary | ICD-10-CM | POA: Diagnosis not present

## 2018-05-19 NOTE — Progress Notes (Signed)
Carelink Summary Report / Loop Recorder 

## 2018-05-28 ENCOUNTER — Encounter: Payer: Self-pay | Admitting: Internal Medicine

## 2018-05-28 ENCOUNTER — Ambulatory Visit (INDEPENDENT_AMBULATORY_CARE_PROVIDER_SITE_OTHER): Payer: 59 | Admitting: Internal Medicine

## 2018-05-28 VITALS — BP 126/74 | HR 67 | Ht 68.0 in | Wt 166.0 lb

## 2018-05-28 DIAGNOSIS — I480 Paroxysmal atrial fibrillation: Secondary | ICD-10-CM | POA: Diagnosis not present

## 2018-05-28 DIAGNOSIS — R002 Palpitations: Secondary | ICD-10-CM

## 2018-05-28 LAB — CUP PACEART INCLINIC DEVICE CHECK
MDC IDC PG IMPLANT DT: 20181218
MDC IDC SESS DTM: 20190710085712

## 2018-05-28 NOTE — Progress Notes (Signed)
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  Since last being seen in our clinic, the patient reports doing very well.  Continues to have afib.  + SOB and chest discomfort during afib.   No ischemic symptoms with exercise when not in afib.  V rates are very elevated when in afib.  Today, he denies symptoms of chest pain, shortness of breath,  lower extremity edema, dizziness, presyncope, or syncope.  The patient is otherwise without complaint today.   Past Medical History:  Diagnosis Date  . Carpal tunnel syndrome 11-03-13   bilateral numbness-worse while sleep-remains an issue  . History of kidney stones    '12 x 1 episode  . Left inguinal hernia   . Paroxysmal atrial fibrillation (HCC)   . Prostate cancer [facility])    prostate   Past Surgical History:  Procedure Laterality Date  . ATRIAL FIBRILLATION ABLATION N/A 01/22/2017   Procedure: Atrial Fibrillation Ablation;  Surgeon: Thompson Grayer, MD;  Location: [place] [facility];  Service: Cardiovascular;  Laterality: N/A;  . COLONOSCOPY  03/2006; 07/16/2011   2007: rectal adenoma 2012:Normal  . ELBOW SURGERY     bilateral ulnar nerve release  . HERNIA REPAIR Left 11/06/13   LIH  . INGUINAL HERNIA REPAIR Left 11/06/2013   Procedure: HERNIA REPAIR INGUINAL ADULT;  Surgeon: Odis Hollingshead, MD;  [facility];  Service: General;  Laterality: Left;  . INSERTION OF MESH Left 11/06/2013   Procedure: INSERTION OF MESH;  Surgeon: Odis Hollingshead, MD;  [facility];  Service: General;  Laterality: Left;  . KNEE ARTHROSCOPY     left  . LOOP RECORDER INSERTION Left 11/05/2017   Procedure: LOOP RECORDER INSERTION;  Surgeon: Thompson Grayer, MD;  Location: [place] [facility];  Service: Cardiovascular;  Laterality: Left;  . PROSTATE BIOPSY  05/04/2011   adenocarcinoma Gleason 6  . ROBOT ASSISTED LAPAROSCOPIC RADICAL PROSTATECTOMY   09/24/2011   Procedure: ROBOTIC ASSISTED LAPAROSCOPIC RADICAL PROSTATECTOMY LEVEL 1;  Surgeon: Dutch Gray, MD;  [facility];  Service: Urology;  Laterality: N/A;    ROS- all systems are reviewed and negatives except as per HPI above  Current Outpatient Medications  Medication Sig Dispense Refill  . calcium carbonate (TUMS - DOSED IN MG ELEMENTAL CALCIUM) 500 MG chewable tablet Chew 1 tablet by mouth as needed for indigestion or heartburn.    . Calcium Carbonate Antacid (ANTACID PO) Take 1 tablet by mouth daily as needed (heartburn).    . clotrimazole-betamethasone (LOTRISONE) cream Apply 1 application topically 2 (two) times daily as needed (for itching).   0  . diltiazem (CARDIZEM) 30 MG tablet Take 1-2 tablets (30-60 mg total) by mouth every 4 (four) hours as needed. 60 tablet 3  . hydrocortisone (ANUSOL-HC) 2.5 % rectal cream Place 1 application rectally 2 (two) times daily. For ten days. 30 g 1  . ibuprofen (ADVIL,MOTRIN) 200 MG tablet Take 800 mg by mouth every 6 (six) hours as needed for headache or moderate pain.    . Multiple Vitamin (MULTIVITAMIN WITH MINERALS) TABS tablet Take 1 tablet by mouth 2 (two) times daily.     . Polyvinyl Alcohol-Povidone (REFRESH OP) Place 1 drop into both eyes as needed (for dry eyes).    Marland Kitchen POTASSIUM PO Take 1 tablet by mouth as needed (for leg cramps in the summer once weekly).     Marland Kitchen  sildenafil (REVATIO) 20 MG tablet Take 20-60 mg by mouth as needed (for ED).     No current facility-administered medications for this visit.     Physical Exam: Vitals:   05/28/18 0820  BP: 126/74  Pulse: 67  Weight: 166 lb (75.3 kg)  Height: 5\' 8"  (1.727 m)    GEN- The patient is well appearing, alert and oriented x 3 today.   Head- normocephalic, atraumatic Eyes-  Sclera clear, conjunctiva pink Ears- hearing intact Oropharynx- clear Lungs- Clear to ausculation bilaterally, normal work of breathing Heart- Regular rate and rhythm, no murmurs, rubs or  gallops, PMI not laterally displaced GI- soft, NT, ND, + BS Extremities- no clubbing, cyanosis, or edema  Wt Readings from Last 3 Encounters:  05/28/18 166 lb (75.3 kg)  02/24/18 164 lb (74.4 kg)  11/05/17 155 lb (70.3 kg)    EKG tracing ordered today is personally reviewed and shows sinus rhythm with PVCs  Assessment and Plan:  1. Paroxysmal atrial fibrillation afib burden is 10.8% (previously 9%) V rates elevated during afib chadsvasc score is 0.  Per guidelines, not on anticoagulation I have advised ablation.  He is agreeable but wishes to wait until the fall.   Does not wish to take AADs and has been reluctant to use prn cardizem.  If he decides to proceed with ablation, he will contact my office.  2. ETOH Avoidance encouraged  3. SOB/ CP Would advise CTA FFR to evaluate cors with CT for ablation  He has a high deductible insurance plan and declines remote monitoring for his ILR due to excessive charges.  I will just check his device in the office.  Return in September to discuss ablation.  Thompson Grayer MD, [facility] 05/28/2018 8:29 AM

## 2018-05-28 NOTE — Patient Instructions (Addendum)
Medication Instructions:  Your physician recommends that you continue on your current medications as directed. Please refer to the Current Medication list given to you today.  Labwork: None ordered.  Testing/Procedures: None ordered.  Follow-Up:  August 04, 2018 at 9:00 am with Dr. Rayann Heman.  Please arrive 15 minutes prior to appt time.   Any Other Special Instructions Will Be Listed Below (If Applicable).  If you need a refill on your cardiac medications before your next appointment, please call your pharmacy.

## 2018-06-11 ENCOUNTER — Telehealth: Payer: Self-pay | Admitting: Cardiology

## 2018-06-11 NOTE — Telephone Encounter (Signed)
LMOVM requesting that pt send manual transmission b/c home monitor has not updated in at least 14 days.    

## 2018-06-17 LAB — CUP PACEART REMOTE DEVICE CHECK
MDC IDC PG IMPLANT DT: 20181218
MDC IDC SESS DTM: 20190629223936

## 2018-07-01 ENCOUNTER — Encounter: Payer: Self-pay | Admitting: Cardiology

## 2018-07-23 ENCOUNTER — Encounter: Payer: Self-pay | Admitting: Cardiology

## 2018-08-04 ENCOUNTER — Encounter: Payer: Self-pay | Admitting: Internal Medicine

## 2018-08-04 ENCOUNTER — Ambulatory Visit (INDEPENDENT_AMBULATORY_CARE_PROVIDER_SITE_OTHER): Payer: 59 | Admitting: Internal Medicine

## 2018-08-04 VITALS — BP 116/82 | HR 73 | Ht 68.0 in | Wt 161.0 lb

## 2018-08-04 DIAGNOSIS — I480 Paroxysmal atrial fibrillation: Secondary | ICD-10-CM | POA: Diagnosis not present

## 2018-08-04 DIAGNOSIS — R002 Palpitations: Secondary | ICD-10-CM

## 2018-08-04 DIAGNOSIS — R0602 Shortness of breath: Secondary | ICD-10-CM

## 2018-08-04 DIAGNOSIS — R0789 Other chest pain: Secondary | ICD-10-CM | POA: Diagnosis not present

## 2018-08-04 LAB — CBC WITH DIFFERENTIAL/PLATELET
BASOS ABS: 0 10*3/uL (ref 0.0–0.2)
Basos: 1 %
EOS (ABSOLUTE): 0.4 10*3/uL (ref 0.0–0.4)
EOS: 7 %
HEMATOCRIT: 47.1 % (ref 37.5–51.0)
Hemoglobin: 17 g/dL (ref 13.0–17.7)
IMMATURE GRANULOCYTES: 0 %
Immature Grans (Abs): 0 10*3/uL (ref 0.0–0.1)
Lymphocytes Absolute: 2.5 10*3/uL (ref 0.7–3.1)
Lymphs: 47 %
MCH: 34.4 pg — ABNORMAL HIGH (ref 26.6–33.0)
MCHC: 36.1 g/dL — ABNORMAL HIGH (ref 31.5–35.7)
MCV: 95 fL (ref 79–97)
MONOS ABS: 0.6 10*3/uL (ref 0.1–0.9)
Monocytes: 11 %
NEUTROS PCT: 34 %
Neutrophils Absolute: 1.9 10*3/uL (ref 1.4–7.0)
Platelets: 188 10*3/uL (ref 150–450)
RBC: 4.94 x10E6/uL (ref 4.14–5.80)
RDW: 13.2 % (ref 12.3–15.4)
WBC: 5.5 10*3/uL (ref 3.4–10.8)

## 2018-08-04 LAB — BASIC METABOLIC PANEL
BUN/Creatinine Ratio: 19 (ref 10–24)
BUN: 20 mg/dL (ref 8–27)
CHLORIDE: 103 mmol/L (ref 96–106)
CO2: 24 mmol/L (ref 20–29)
CREATININE: 1.06 mg/dL (ref 0.76–1.27)
Calcium: 9.8 mg/dL (ref 8.6–10.2)
GFR calc Af Amer: 87 mL/min/{1.73_m2} (ref >59)
GFR calc non Af Amer: 75 mL/min/{1.73_m2} (ref >59)
GLUCOSE: 87 mg/dL (ref 65–99)
Potassium: 4.4 mmol/L (ref 3.5–5.2)
SODIUM: 140 mmol/L (ref 134–144)

## 2018-08-04 NOTE — Progress Notes (Signed)
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  Since last being seen in our clinic, the patient reports doing reasonably well.  heh as had episodes of SOB.  He also has diaphoresis and chest pain intermittently.  This has increased since his last visit.  He is very concerned about CAD as the cause. Today, he denies symptoms of palpitations, lower extremity edema, dizziness, presyncope, or syncope.  The patient is otherwise without complaint today.   Past Medical History:  Diagnosis Date  . Carpal tunnel syndrome 11-03-13   bilateral numbness-worse while sleep-remains an issue  . History of kidney stones    '12 x 1 episode  . Left inguinal hernia   . Paroxysmal atrial fibrillation (HCC)   . Prostate cancer [facility])    prostate   Past Surgical History:  Procedure Laterality Date  . ATRIAL FIBRILLATION ABLATION N/A 01/22/2017   Procedure: Atrial Fibrillation Ablation;  Surgeon: Thompson Grayer, MD;  Location: [place] [facility];  Service: Cardiovascular;  Laterality: N/A;  . COLONOSCOPY  03/2006; 07/16/2011   2007: rectal adenoma 2012:Normal  . ELBOW SURGERY     bilateral ulnar nerve release  . HERNIA REPAIR Left 11/06/13   LIH  . INGUINAL HERNIA REPAIR Left 11/06/2013   Procedure: HERNIA REPAIR INGUINAL ADULT;  Surgeon: Odis Hollingshead, MD;  [facility];  Service: General;  Laterality: Left;  . INSERTION OF MESH Left 11/06/2013   Procedure: INSERTION OF MESH;  Surgeon: Odis Hollingshead, MD;  [facility];  Service: General;  Laterality: Left;  . KNEE ARTHROSCOPY     left  . LOOP RECORDER INSERTION Left 11/05/2017   Procedure: LOOP RECORDER INSERTION;  Surgeon: Thompson Grayer, MD;  Location: [place] [facility];  Service: Cardiovascular;  Laterality: Left;  . PROSTATE BIOPSY  05/04/2011   adenocarcinoma Gleason 6  . ROBOT ASSISTED LAPAROSCOPIC RADICAL PROSTATECTOMY  09/24/2011     Procedure: ROBOTIC ASSISTED LAPAROSCOPIC RADICAL PROSTATECTOMY LEVEL 1;  Surgeon: Dutch Gray, MD;  [facility];  Service: Urology;  Laterality: N/A;    ROS- all systems are reviewed and negatives except as per HPI above  Current Outpatient Medications  Medication Sig Dispense Refill  . calcium carbonate (TUMS - DOSED IN MG ELEMENTAL CALCIUM) 500 MG chewable tablet Chew 1 tablet by mouth as needed for indigestion or heartburn.    . Calcium Carbonate Antacid (ANTACID PO) Take 1 tablet by mouth daily as needed (heartburn).    . clotrimazole-betamethasone (LOTRISONE) cream Apply 1 application topically 2 (two) times daily as needed (for itching).   0  . diltiazem (CARDIZEM) 30 MG tablet Take 1-2 tablets (30-60 mg total) by mouth every 4 (four) hours as needed. 60 tablet 3  . hydrocortisone (ANUSOL-HC) 2.5 % rectal cream Place 1 application rectally as needed for hemorrhoids or anal itching.    Marland Kitchen ibuprofen (ADVIL,MOTRIN) 200 MG tablet Take 800 mg by mouth every 6 (six) hours as needed for headache or moderate pain.    . Multiple Vitamin (MULTIVITAMIN WITH MINERALS) TABS tablet Take 1 tablet by mouth 2 (two) times daily.     . Polyvinyl Alcohol-Povidone (REFRESH OP) Place 1 drop into both eyes as needed (for dry eyes).    Marland Kitchen POTASSIUM PO Take 1 tablet by mouth as needed (for leg cramps in the summer once weekly).     . sildenafil (REVATIO) 20 MG  tablet Take 20-60 mg by mouth as needed (for ED).     No current facility-administered medications for this visit.     Physical Exam: Vitals:   08/04/18 0905  BP: 116/82  Pulse: 73  SpO2: 97%  Weight: 161 lb (73 kg)  Height: 5\' 8"  (1.727 m)    GEN- The patient is well appearing, alert and oriented x 3 today.   Head- normocephalic, atraumatic Eyes-  Sclera clear, conjunctiva pink Ears- hearing intact Oropharynx- clear Lungs- Clear to ausculation bilaterally, normal work of breathing Heart- Regular rate and rhythm, no murmurs, rubs or  gallops, PMI not laterally displaced GI- soft, NT, ND, + BS Extremities- no clubbing, cyanosis, or edema  Wt Readings from Last 3 Encounters:  08/04/18 161 lb (73 kg)  05/28/18 166 lb (75.3 kg)  02/24/18 164 lb (74.4 kg)    EKG tracing ordered today is personally reviewed and shows sinus rhythm  Assessment and Plan:  1. Paroxysmal atrial fibrillation afib burden todoay is 8 % (previously 10.8%) chads2vasc score is 0. Continue current therapy.  He will consider ablation if AF worsens  2. SOB/CP Worrisome for angina as the cause. I would advise cath over stress testing given high pretest probability based on symptoms and also as we are considering ablation with general anesthesia pending results. If he has surgical disease, I would favor CABG with MAZE and LAA amputation.  If he requires PCI, we will likely wait 2-3 months before ablation.  If no CAD, we may schedule ablation when I see him again in 1 month.  I would therefore recommend left heart catheterization with possible PCI.  Discussed the cath with the patient. The patient understands that risks included but are not limited to stroke (1 in 1000), death (1 in 63), kidney failure [usually temporary] (1 in 500), bleeding (1 in 200), allergic reaction [possibly serious] (1 in 200). The patient understands and agrees to proceed.   3. ETOH Avoidance encouraged  Return to see me 1 month post cath.  Thompson Grayer MD, [facility] 08/04/2018 9:31 AM

## 2018-08-04 NOTE — H&P (View-Only) (Signed)
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  Since last being seen in our clinic, the patient reports doing reasonably well.  heh as had episodes of SOB.  He also has diaphoresis and chest pain intermittently.  This has increased since his last visit.  He is very concerned about CAD as the cause. Today, he denies symptoms of palpitations, lower extremity edema, dizziness, presyncope, or syncope.  The patient is otherwise without complaint today.   Past Medical History:  Diagnosis Date  . Carpal tunnel syndrome 11-03-13   bilateral numbness-worse while sleep-remains an issue  . History of kidney stones    '12 x 1 episode  . Left inguinal hernia   . Paroxysmal atrial fibrillation (HCC)   . Prostate cancer [facility])    prostate   Past Surgical History:  Procedure Laterality Date  . ATRIAL FIBRILLATION ABLATION N/A 01/22/2017   Procedure: Atrial Fibrillation Ablation;  Surgeon: Thompson Grayer, MD;  Location: [place] [facility];  Service: Cardiovascular;  Laterality: N/A;  . COLONOSCOPY  03/2006; 07/16/2011   2007: rectal adenoma 2012:Normal  . ELBOW SURGERY     bilateral ulnar nerve release  . HERNIA REPAIR Left 11/06/13   LIH  . INGUINAL HERNIA REPAIR Left 11/06/2013   Procedure: HERNIA REPAIR INGUINAL ADULT;  Surgeon: Odis Hollingshead, MD;  [facility];  Service: General;  Laterality: Left;  . INSERTION OF MESH Left 11/06/2013   Procedure: INSERTION OF MESH;  Surgeon: Odis Hollingshead, MD;  [facility];  Service: General;  Laterality: Left;  . KNEE ARTHROSCOPY     left  . LOOP RECORDER INSERTION Left 11/05/2017   Procedure: LOOP RECORDER INSERTION;  Surgeon: Thompson Grayer, MD;  Location: [place] [facility];  Service: Cardiovascular;  Laterality: Left;  . PROSTATE BIOPSY  05/04/2011   adenocarcinoma Gleason 6  . ROBOT ASSISTED LAPAROSCOPIC RADICAL PROSTATECTOMY  09/24/2011     Procedure: ROBOTIC ASSISTED LAPAROSCOPIC RADICAL PROSTATECTOMY LEVEL 1;  Surgeon: Dutch Gray, MD;  [facility];  Service: Urology;  Laterality: N/A;    ROS- all systems are reviewed and negatives except as per HPI above  Current Outpatient Medications  Medication Sig Dispense Refill  . calcium carbonate (TUMS - DOSED IN MG ELEMENTAL CALCIUM) 500 MG chewable tablet Chew 1 tablet by mouth as needed for indigestion or heartburn.    . Calcium Carbonate Antacid (ANTACID PO) Take 1 tablet by mouth daily as needed (heartburn).    . clotrimazole-betamethasone (LOTRISONE) cream Apply 1 application topically 2 (two) times daily as needed (for itching).   0  . diltiazem (CARDIZEM) 30 MG tablet Take 1-2 tablets (30-60 mg total) by mouth every 4 (four) hours as needed. 60 tablet 3  . hydrocortisone (ANUSOL-HC) 2.5 % rectal cream Place 1 application rectally as needed for hemorrhoids or anal itching.    Marland Kitchen ibuprofen (ADVIL,MOTRIN) 200 MG tablet Take 800 mg by mouth every 6 (six) hours as needed for headache or moderate pain.    . Multiple Vitamin (MULTIVITAMIN WITH MINERALS) TABS tablet Take 1 tablet by mouth 2 (two) times daily.     . Polyvinyl Alcohol-Povidone (REFRESH OP) Place 1 drop into both eyes as needed (for dry eyes).    Marland Kitchen POTASSIUM PO Take 1 tablet by mouth as needed (for leg cramps in the summer once weekly).     . sildenafil (REVATIO) 20 MG  tablet Take 20-60 mg by mouth as needed (for ED).     No current facility-administered medications for this visit.     Physical Exam: Vitals:   08/04/18 0905  BP: 116/82  Pulse: 73  SpO2: 97%  Weight: 161 lb (73 kg)  Height: 5\' 8"  (1.727 m)    GEN- The patient is well appearing, alert and oriented x 3 today.   Head- normocephalic, atraumatic Eyes-  Sclera clear, conjunctiva pink Ears- hearing intact Oropharynx- clear Lungs- Clear to ausculation bilaterally, normal work of breathing Heart- Regular rate and rhythm, no murmurs, rubs or  gallops, PMI not laterally displaced GI- soft, NT, ND, + BS Extremities- no clubbing, cyanosis, or edema  Wt Readings from Last 3 Encounters:  08/04/18 161 lb (73 kg)  05/28/18 166 lb (75.3 kg)  02/24/18 164 lb (74.4 kg)    EKG tracing ordered today is personally reviewed and shows sinus rhythm  Assessment and Plan:  1. Paroxysmal atrial fibrillation afib burden todoay is 8 % (previously 10.8%) chads2vasc score is 0. Continue current therapy.  He will consider ablation if AF worsens  2. SOB/CP Worrisome for angina as the cause. I would advise cath over stress testing given high pretest probability based on symptoms and also as we are considering ablation with general anesthesia pending results. If he has surgical disease, I would favor CABG with MAZE and LAA amputation.  If he requires PCI, we will likely wait 2-3 months before ablation.  If no CAD, we may schedule ablation when I see him again in 1 month.  I would therefore recommend left heart catheterization with possible PCI.  Discussed the cath with the patient. The patient understands that risks included but are not limited to stroke (1 in 1000), death (1 in 35), kidney failure [usually temporary] (1 in 500), bleeding (1 in 200), allergic reaction [possibly serious] (1 in 200). The patient understands and agrees to proceed.   3. ETOH Avoidance encouraged  Return to see me 1 month post cath.  Thompson Grayer MD, [facility] 08/04/2018 9:31 AM

## 2018-08-04 NOTE — Patient Instructions (Addendum)
Medication Instructions:  Your physician recommends that you continue on your current medications as directed. Please refer to the Current Medication list given to you today.  Labwork: You will get lab work today:  BMP and CBC  Testing/Procedures: None ordered.  Follow-Up: Your physician wants you to follow-up in:  5 weeks with Dr. Rayann Heman.     Any Other Special Instructions Will Be Listed Below (If Applicable).   Ricardo [name]  08/04/2018  You are scheduled for a Cardiac Catheterization on Monday, September 23 with Dr. Daneen Schick.  1. Please arrive at the [facility] (Main Entrance A) at [facility]: [address] [place], [place] [address] at 5:30 AM (This time is two hours before your procedure to ensure your preparation). Free valet parking service is available.   Special note: Every effort is made to have your procedure done on time. Please understand that emergencies sometimes delay scheduled procedures.  2. Diet: Do not eat solid foods after midnight.  The patient may have clear liquids until 5am upon the day of the procedure.  3. Labs: drawn today  4. Medication instructions in preparation for your procedure:   On the morning of your procedure, take one 81 mg ASPIRIN.  DO NOT take any other medications.  5. Plan for one night stay--bring personal belongings. 6. Bring a current list of your medications and current insurance cards. 7. You MUST have a responsible person to drive you home. 8. Someone MUST be with you the first 24 hours after you arrive home or your discharge will be delayed. 9. Please wear clothes that are easy to get on and off and wear slip-on shoes.  Thank you for allowing [place] to care for you!   -- [facility]

## 2018-08-07 ENCOUNTER — Telehealth: Payer: Self-pay | Admitting: *Deleted

## 2018-08-07 NOTE — Telephone Encounter (Signed)
Pt contacted pre-catheterization scheduled at [facility] for: Monday August 11, 2018 7:30 AM Verified arrival time and place: [place] Entrance A at: 5:30 AM  No solid food after midnight prior to cath, clear liquids until 5 AM day of procedure. Verified no known allergies. Verified no diabetes medications.  Hold: Sildenafil-24 hours pre-procedure.  AM meds can be  taken pre-cath with sip of water including: ASA 81 mg  Confirmed patient has responsible person to drive home post procedure and for 24 hours after you arrive home: yes

## 2018-08-10 DIAGNOSIS — R0602 Shortness of breath: Secondary | ICD-10-CM

## 2018-08-10 DIAGNOSIS — R079 Chest pain, unspecified: Secondary | ICD-10-CM

## 2018-08-11 ENCOUNTER — Encounter (HOSPITAL_COMMUNITY): Payer: Self-pay | Admitting: Interventional Cardiology

## 2018-08-11 ENCOUNTER — Other Ambulatory Visit: Payer: Self-pay

## 2018-08-11 ENCOUNTER — Encounter (HOSPITAL_COMMUNITY)
Admission: RE | Disposition: A | Discharge status: Home / Self Care | Payer: Self-pay | Source: Ambulatory Visit | Attending: Interventional Cardiology

## 2018-08-11 ENCOUNTER — Ambulatory Visit (HOSPITAL_COMMUNITY)
Admission: RE | Admit: 2018-08-11 | Discharge: 2018-08-11 | Disposition: A | Discharge status: Home / Self Care | Payer: 59 | Source: Ambulatory Visit | Attending: Interventional Cardiology | Admitting: Interventional Cardiology

## 2018-08-11 DIAGNOSIS — Z8546 Personal history of malignant neoplasm of prostate: Secondary | ICD-10-CM | POA: Diagnosis not present

## 2018-08-11 DIAGNOSIS — I2510 Atherosclerotic heart disease of native coronary artery without angina pectoris: Secondary | ICD-10-CM | POA: Insufficient documentation

## 2018-08-11 DIAGNOSIS — R079 Chest pain, unspecified: Secondary | ICD-10-CM

## 2018-08-11 DIAGNOSIS — Z9889 Other specified postprocedural states: Secondary | ICD-10-CM | POA: Diagnosis not present

## 2018-08-11 DIAGNOSIS — C61 Malignant neoplasm of prostate: Secondary | ICD-10-CM | POA: Diagnosis present

## 2018-08-11 DIAGNOSIS — Z87442 Personal history of urinary calculi: Secondary | ICD-10-CM | POA: Diagnosis not present

## 2018-08-11 DIAGNOSIS — R0600 Dyspnea, unspecified: Secondary | ICD-10-CM | POA: Insufficient documentation

## 2018-08-11 DIAGNOSIS — Z9079 Acquired absence of other genital organ(s): Secondary | ICD-10-CM | POA: Insufficient documentation

## 2018-08-11 DIAGNOSIS — Z79899 Other long term (current) drug therapy: Secondary | ICD-10-CM | POA: Diagnosis not present

## 2018-08-11 DIAGNOSIS — G5600 Carpal tunnel syndrome, unspecified upper limb: Secondary | ICD-10-CM | POA: Insufficient documentation

## 2018-08-11 DIAGNOSIS — I480 Paroxysmal atrial fibrillation: Secondary | ICD-10-CM | POA: Diagnosis not present

## 2018-08-11 DIAGNOSIS — I4891 Unspecified atrial fibrillation: Secondary | ICD-10-CM | POA: Diagnosis present

## 2018-08-11 DIAGNOSIS — R0602 Shortness of breath: Secondary | ICD-10-CM

## 2018-08-11 HISTORY — PX: ULTRASOUND GUIDANCE FOR VASCULAR ACCESS: SHX6516

## 2018-08-11 HISTORY — PX: LEFT HEART CATH AND CORONARY ANGIOGRAPHY: CATH118249

## 2018-08-11 SURGERY — LEFT HEART CATH AND CORONARY ANGIOGRAPHY
Anesthesia: LOCAL

## 2018-08-11 MED ORDER — SODIUM CHLORIDE 0.9% FLUSH: 3.0000 mL | Freq: Two times a day (BID) | INTRAVENOUS | Status: DC

## 2018-08-11 MED ORDER — HEPARIN SODIUM (PORCINE) 1000 UNIT/ML IJ SOLN
INTRAMUSCULAR | Status: DC | PRN
  Administered 2018-08-11: 3500 [IU] via INTRAVENOUS

## 2018-08-11 MED ORDER — NITROGLYCERIN 1 MG/10 ML FOR IR/CATH LAB
INTRA_ARTERIAL | Status: AC
  Filled 2018-08-11: qty 10

## 2018-08-11 MED ORDER — HEPARIN (PORCINE) IN NACL 1000-0.9 UT/500ML-% IV SOLN
INTRAVENOUS | Status: AC
  Filled 2018-08-11: qty 500

## 2018-08-11 MED ORDER — HEPARIN (PORCINE) IN NACL 1000-0.9 UT/500ML-% IV SOLN
INTRAVENOUS | Status: DC | PRN
  Administered 2018-08-11 (×2): 500 mL

## 2018-08-11 MED ORDER — SODIUM CHLORIDE 0.9 % IV SOLN: INTRAVENOUS | Status: DC

## 2018-08-11 MED ORDER — SODIUM CHLORIDE 0.9 % IV SOLN
INTRAVENOUS | Status: AC | PRN
  Administered 2018-08-11: 999 mL/h via INTRAVENOUS

## 2018-08-11 MED ORDER — SODIUM CHLORIDE 0.9% FLUSH: 3.0000 mL | INTRAVENOUS | Status: DC | PRN

## 2018-08-11 MED ORDER — IOHEXOL 350 MG/ML SOLN
INTRAVENOUS | Status: DC | PRN
  Administered 2018-08-11: 75 mL via INTRAVENOUS

## 2018-08-11 MED ORDER — ONDANSETRON HCL 4 MG/2ML IJ SOLN: 4.0000 mg | Freq: Four times a day (QID) | INTRAMUSCULAR | Status: DC | PRN

## 2018-08-11 MED ORDER — MIDAZOLAM HCL 2 MG/2ML IJ SOLN
INTRAMUSCULAR | Status: DC | PRN
  Administered 2018-08-11 (×2): 1 mg via INTRAVENOUS

## 2018-08-11 MED ORDER — FENTANYL CITRATE (PF) 100 MCG/2ML IJ SOLN
INTRAMUSCULAR | Status: DC | PRN
  Administered 2018-08-11: 50 ug via INTRAVENOUS

## 2018-08-11 MED ORDER — SODIUM CHLORIDE 0.9 % WEIGHT BASED INFUSION
3.0000 mL/kg/h | INTRAVENOUS | Status: AC
  Administered 2018-08-11: 3 mL/kg/h via INTRAVENOUS

## 2018-08-11 MED ORDER — SODIUM CHLORIDE 0.9 % WEIGHT BASED INFUSION: 1.0000 mL/kg/h | INTRAVENOUS | Status: DC

## 2018-08-11 MED ORDER — ASPIRIN 81 MG PO CHEW: 81.0000 mg | CHEWABLE_TABLET | ORAL | Status: DC

## 2018-08-11 MED ORDER — HEPARIN SODIUM (PORCINE) 1000 UNIT/ML IJ SOLN
INTRAMUSCULAR | Status: AC
  Filled 2018-08-11: qty 1

## 2018-08-11 MED ORDER — VERAPAMIL HCL 2.5 MG/ML IV SOLN
INTRAVENOUS | Status: DC | PRN
  Administered 2018-08-11: 10 mL via INTRA_ARTERIAL

## 2018-08-11 MED ORDER — NITROGLYCERIN 1 MG/10 ML FOR IR/CATH LAB
INTRA_ARTERIAL | Status: DC | PRN
  Administered 2018-08-11: 200 ug via INTRACORONARY

## 2018-08-11 MED ORDER — LIDOCAINE HCL (PF) 1 % IJ SOLN
INTRAMUSCULAR | Status: AC
  Filled 2018-08-11: qty 30

## 2018-08-11 MED ORDER — SODIUM CHLORIDE 0.9 % IV SOLN: 250.0000 mL | INTRAVENOUS | Status: DC | PRN

## 2018-08-11 MED ORDER — LIDOCAINE HCL (PF) 1 % IJ SOLN
INTRAMUSCULAR | Status: DC | PRN
  Administered 2018-08-11: 3 mL

## 2018-08-11 MED ORDER — MIDAZOLAM HCL 2 MG/2ML IJ SOLN
INTRAMUSCULAR | Status: AC
  Filled 2018-08-11: qty 2

## 2018-08-11 MED ORDER — FENTANYL CITRATE (PF) 100 MCG/2ML IJ SOLN
INTRAMUSCULAR | Status: AC
  Filled 2018-08-11: qty 2

## 2018-08-11 MED ORDER — VERAPAMIL HCL 2.5 MG/ML IV SOLN
INTRAVENOUS | Status: AC
  Filled 2018-08-11: qty 2

## 2018-08-11 MED ORDER — ACETAMINOPHEN 325 MG PO TABS: 650.0000 mg | ORAL_TABLET | ORAL | Status: DC | PRN

## 2018-08-11 SURGICAL SUPPLY — 10 items
CATH 5FR JL3.5 JR4 ANG PIG MP (CATHETERS) ×1 IMPLANT
DEVICE RAD COMP TR BAND LRG (VASCULAR PRODUCTS) ×1 IMPLANT
GLIDESHEATH SLEND A-KIT 6F 22G (SHEATH) ×1 IMPLANT
GUIDEWIRE INQWIRE 1.5J.035X260 (WIRE) IMPLANT
INQWIRE 1.5J .035X260CM (WIRE) ×3
KIT HEART LEFT (KITS) ×3 IMPLANT
PACK CARDIAC CATHETERIZATION (CUSTOM PROCEDURE TRAY) ×3 IMPLANT
SHEATH PROBE COVER 6X72 (BAG) ×1 IMPLANT
TRANSDUCER W/STOPCOCK (MISCELLANEOUS) ×3 IMPLANT
TUBING CIL FLEX 10 FLL-RA (TUBING) ×3 IMPLANT

## 2018-08-11 NOTE — Interval H&P Note (Signed)
Cath Lab Visit (complete for each Cath Lab visit)  Clinical Evaluation Leading to the Procedure:   ACS: No.  Non-ACS:    Anginal Classification: CCS III  Anti-ischemic medical therapy: Maximal Therapy (2 or more classes of medications)  Non-Invasive Test Results: Intermediate-risk stress test findings: cardiac mortality 1-3%/year  Prior CABG: No previous CABG      History and Physical Interval Note:  08/11/2018 7:27 AM  Ricardo [name]  has presented today for surgery, with the diagnosis of shortness of breath - chest discomfort  The various methods of treatment have been discussed with the patient and family. After consideration of risks, benefits and other options for treatment, the patient has consented to  Procedure(s): LEFT HEART CATH AND CORONARY ANGIOGRAPHY (N/A) as a surgical intervention .  The patient's history has been reviewed, patient examined, no change in status, stable for surgery.  I have reviewed the patient's chart and labs.  Questions were answered to the patient's satisfaction.     Belva Crome III

## 2018-08-11 NOTE — Discharge Instructions (Signed)

## 2018-08-11 NOTE — Progress Notes (Signed)
Pt ambulated in the hall and to the BR, standby assist. Voided and ambulated without difficulty. Right wrist site remains unremarkable.

## 2018-08-12 ENCOUNTER — Telehealth: Payer: Self-pay

## 2018-08-12 DIAGNOSIS — E785 Hyperlipidemia, unspecified: Secondary | ICD-10-CM

## 2018-08-12 NOTE — Telephone Encounter (Signed)
-----   Message from Thompson Grayer, MD sent at 08/11/2018 10:12 PM EDT ----- As per Dr Tamala Julian,  Sonia Baller, please arrange fasting lipid panel followed by pharmacy eval to initiate and manage high intensity statin.  Also, Sonia Baller, please see if he would like to be scheduled for AF ablation or just wait to see how he is doing when I see him in October.  Thanks JA   ----- Message ----- From: Belva Crome, MD Sent: 08/11/2018   8:24 AM EDT To: Thompson Grayer, MD  Needs lipid panel and initiation of high intensity statin therapy!  HS

## 2018-08-12 NOTE — Telephone Encounter (Signed)
Call placed to Pt.  Advised Pt he will need fasting cholesterol check. Pt will come tomorrow for bloodwork.  Advised this nurse would be referring him to our lipid clinic for initiation of statin therapy.  Pt states he does not want to be on statin long term.  States he is changing his diet.  Pt states he will discuss ablation at next office visit with Dr. Rayann Heman.

## 2018-08-13 ENCOUNTER — Other Ambulatory Visit: Payer: 59 | Admitting: *Deleted

## 2018-08-13 DIAGNOSIS — E785 Hyperlipidemia, unspecified: Secondary | ICD-10-CM

## 2018-08-13 LAB — LIPID PANEL
Chol/HDL Ratio: 2.5 ratio (ref 0.0–5.0)
Cholesterol, Total: 171 mg/dL (ref 100–199)
HDL: 69 mg/dL (ref >39)
LDL Calculated: 87 mg/dL (ref 0–99)
TRIGLYCERIDES: 74 mg/dL (ref 0–149)
VLDL Cholesterol Cal: 15 mg/dL (ref 5–40)

## 2018-08-18 ENCOUNTER — Encounter: Payer: Self-pay | Admitting: Pharmacist

## 2018-08-18 ENCOUNTER — Ambulatory Visit (INDEPENDENT_AMBULATORY_CARE_PROVIDER_SITE_OTHER): Payer: 59 | Admitting: Pharmacist

## 2018-08-18 DIAGNOSIS — E785 Hyperlipidemia, unspecified: Secondary | ICD-10-CM

## 2018-08-18 NOTE — Progress Notes (Signed)
Patient ID: LATRAVION GRAVES                 DOB: 20-Jul-1956                    MRN: [id number]     HPI: Ricardo [name] is a 62 y.o. male patient of Dr. Rayann Heman that presents today for lipid evaluation.  PMH includes CAD, Afib. Pt with recent cath to evaluate episodes of SOB. Cath revealed 40-50% blockage in LAD and it was recommended to proceed with aggressive risk factor modification.   He presents today for discussion of cholesterol. He states that he has previously controlled his cholesterol with diet and exercise. He has never tried a statin medication, but has heard that they can change blood sugars and cause joint aches.   Risk Factors: CAD LDL Goal: <70  Current Medications: none Intolerances: none?  Diet: Eats from out and from home. He eats sandwiches roast beef, rueben. He does get vegetable plate or salad once a week. He eats red meat and chicken about equal. He admits he could be better with vegetables. He prefers home grown vegetables. He does cook with butter. He drinks mostly water.   Exercise: He has not been exercising. He plans to get back to the gym this week.   Family History: Mom with pacemaker  Social History: He drinks a beer or mixed drink per night. Former smoker  Labs: 08/13/18:  TC 171, TG 74, HDL 69, LDL 87 (no therapy)  Past Medical History:  Diagnosis Date  . Carpal tunnel syndrome 11-03-13   bilateral numbness-worse while sleep-remains an issue  . History of kidney stones    '12 x 1 episode  . Left inguinal hernia   . Paroxysmal atrial fibrillation (HCC)   . Prostate cancer [facility])    prostate    Current Outpatient Medications on File Prior to Visit  Medication Sig Dispense Refill  . aspirin EC 81 MG tablet Take 81 mg by mouth daily.    . calcium carbonate (TUMS - DOSED IN MG ELEMENTAL CALCIUM) 500 MG chewable tablet Chew 1 tablet by mouth as needed for indigestion or heartburn.    . clotrimazole-betamethasone (LOTRISONE) cream Apply 1 application  topically 2 (two) times daily as needed (for itching).   0  . diltiazem (CARDIZEM) 30 MG tablet Take 1-2 tablets (30-60 mg total) by mouth every 4 (four) hours as needed. 60 tablet 3  . Multiple Vitamin (MULTIVITAMIN WITH MINERALS) TABS tablet Take 1 tablet by mouth 2 (two) times daily. DoTerra Vitamin    . Polyvinyl Alcohol-Povidone (REFRESH OP) Place 1 drop into both eyes as needed (for dry eyes).    . Potassium 99 MG TABS Take 1 tablet by mouth as needed (for leg cramps in the summer once weekly).     . sildenafil (REVATIO) 20 MG tablet Take 20-60 mg by mouth daily as needed (for ED).      No current facility-administered medications on file prior to visit.     No Known Allergies  Assessment/Plan: Hyperlipidemia: LDL not at goal. Lengthy discussion today on need for aggressive lifestyle modifications and cholesterol management. He is somewhat resistant to starting a statin medication and would like to try to bring LDL to goal with diet modifications. Discussed benefit of statin medications. He would be willing to start low dose statin if LDL is not at goal in 2 months. He will be diligent with lifestyle modifications and repeat his cholesterol panel  in 2 months. Appt scheduled to discuss results should his cholesterol not be at goal.    Thank you,  Lelan Pons. Patterson Hammersmith, [facility]  08/18/2018 7:53 AM

## 2018-08-18 NOTE — Patient Instructions (Addendum)
Work on diet and exercise to reduce your cholesterol.   We will recheck your cholesterol panel in 2 months to determine if you need cholesterol medication.   Your bad cholesterol  (LDL) is 87 - we want less than 70  Total cholesterol 171, Triglycerides 74, HDL 69  If your cholesterol is not at goal we will consider starting rosuvastatin or atorvastatin.    Cholesterol Cholesterol is a fat. Your body needs a small amount of cholesterol. Cholesterol (plaque) may build up in your blood vessels (arteries). That makes you more likely to have a heart attack or stroke. You cannot feel your cholesterol level. Having a blood test is the only way to find out if your level is high. Keep your test results. Work with your doctor to keep your cholesterol at a good level. What do the results mean?  Total cholesterol is how much cholesterol is in your blood.  LDL is bad cholesterol. This is the type that can build up. Try to have low LDL.  HDL is good cholesterol. It cleans your blood vessels and carries LDL away. Try to have high HDL.  Triglycerides are fat that the body can store or burn for energy. What are good levels of cholesterol?  Total cholesterol below 200.  LDL below 100 is good for people who have health risks. LDL below 70 is good for people who have very high risks.  HDL above 40 is good. It is best to have HDL of 60 or higher.  Triglycerides below 150. How can I lower my cholesterol? Diet Follow your diet program as told by your doctor.  Choose fish, white meat chicken, or [place] that is roasted or baked. Try not to eat red meat, fried foods, sausage, or lunch meats.  Eat lots of fresh fruits and vegetables.  Choose whole grains, beans, pasta, potatoes, and cereals.  Choose olive oil, corn oil, or canola oil. Only use small amounts.  Try not to eat butter, mayonnaise, shortening, or palm kernel oils.  Try not to eat foods with trans fats.  Choose low-fat or nonfat dairy  foods. ? Drink skim or nonfat milk. ? Eat low-fat or nonfat yogurt and cheeses. ? Try not to drink whole milk or cream. ? Try not to eat ice cream, egg yolks, or full-fat cheeses.  Healthy desserts include angel food cake, ginger snaps, animal crackers, hard candy, popsicles, and low-fat or nonfat frozen yogurt. Try not to eat pastries, cakes, pies, and cookies.  Exercise Follow your exercise program as told by your doctor.  Be more active. Try gardening, walking, and taking the stairs.  Ask your doctor about ways that you can be more active.  Medicine  Take over-the-counter and prescription medicines only as told by your doctor. This information is not intended to replace advice given to you by your health care provider. Make sure you discuss any questions you have with your health care provider. Document Released: 02/01/2009 Document Revised: 06/06/2016 Document Reviewed: 05/17/2016 Elsevier Interactive Patient Education  [employer].

## 2018-09-02 LAB — CUP PACEART INCLINIC DEVICE CHECK
MDC IDC PG IMPLANT DT: 20181218
MDC IDC SESS DTM: 20190916130629

## 2018-09-10 ENCOUNTER — Ambulatory Visit (INDEPENDENT_AMBULATORY_CARE_PROVIDER_SITE_OTHER): Payer: 59 | Admitting: Internal Medicine

## 2018-09-10 ENCOUNTER — Encounter: Payer: Self-pay | Admitting: Internal Medicine

## 2018-09-10 VITALS — BP 126/78 | HR 73 | Ht 68.0 in | Wt 150.0 lb

## 2018-09-10 DIAGNOSIS — I480 Paroxysmal atrial fibrillation: Secondary | ICD-10-CM | POA: Diagnosis not present

## 2018-09-10 MED ORDER — METOPROLOL TARTRATE 100 MG PO TABS: 100.0000 mg | ORAL_TABLET | Freq: Once | ORAL | 0 refills | Status: DC

## 2018-09-10 MED ORDER — RIVAROXABAN 20 MG PO TABS: 20.0000 mg | ORAL_TABLET | Freq: Every day | ORAL | 11 refills | Status: DC

## 2018-09-10 NOTE — Progress Notes (Signed)
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  Since last being seen in our clinic, the patient reports doing very well.  Cath by Dr Tamala Julian 08/11/18 is reviewed.  He has nonobstructive CAD.  He continues to have fatigue with decreased exercise tolerance.  Today, he denies symptoms of palpitations, chest pain, shortness of breath,  lower extremity edema, dizziness, presyncope, or syncope.  The patient is otherwise without complaint today.   Past Medical History:  Diagnosis Date  . Carpal tunnel syndrome 11-03-13   bilateral numbness-worse while sleep-remains an issue  . History of kidney stones    '12 x 1 episode  . Left inguinal hernia   . Paroxysmal atrial fibrillation (HCC)   . Prostate cancer [facility])    prostate   Past Surgical History:  Procedure Laterality Date  . ATRIAL FIBRILLATION ABLATION N/A 01/22/2017   Procedure: Atrial Fibrillation Ablation;  Surgeon: Thompson Grayer, MD;  Location: [place] [facility];  Service: Cardiovascular;  Laterality: N/A;  . COLONOSCOPY  03/2006; 07/16/2011   2007: rectal adenoma 2012:Normal  . ELBOW SURGERY     bilateral ulnar nerve release  . HERNIA REPAIR Left 11/06/13   [facility]  . INGUINAL HERNIA REPAIR Left 11/06/2013   Procedure: HERNIA REPAIR INGUINAL ADULT;  Surgeon: Odis Hollingshead, MD;  Location: [facility];  Service: General;  Laterality: Left;  . INSERTION OF MESH Left 11/06/2013   Procedure: INSERTION OF MESH;  Surgeon: Odis Hollingshead, MD;  Location: [facility];  Service: General;  Laterality: Left;  . KNEE ARTHROSCOPY     left  . LEFT HEART CATH AND CORONARY ANGIOGRAPHY N/A 08/11/2018   Procedure: LEFT HEART CATH AND CORONARY ANGIOGRAPHY;  Surgeon: Belva Crome, MD;  Location: [place] [facility];  Service: Cardiovascular;  Laterality: N/A;  . LOOP RECORDER INSERTION Left 11/05/2017   Procedure: LOOP RECORDER INSERTION;  Surgeon: Thompson Grayer, MD;  Location: [place] [facility];  Service: Cardiovascular;  Laterality: Left;  . PROSTATE BIOPSY  05/04/2011   adenocarcinoma Gleason 6  . ROBOT ASSISTED LAPAROSCOPIC RADICAL PROSTATECTOMY  09/24/2011   Procedure: ROBOTIC ASSISTED LAPAROSCOPIC RADICAL PROSTATECTOMY LEVEL 1;  Surgeon: Dutch Gray, MD;  Location: [facility];  Service: Urology;  Laterality: N/A;  . ULTRASOUND GUIDANCE FOR VASCULAR ACCESS  08/11/2018   Procedure: Ultrasound Guidance For Vascular Access;  Surgeon: Belva Crome, MD;  Location: [place] [facility];  Service: Cardiovascular;;    ROS- all systems are reviewed and negatives except as per HPI above  Current Outpatient Medications  Medication Sig Dispense Refill  . aspirin EC 81 MG tablet Take 81 mg by mouth daily.    . calcium carbonate (TUMS - DOSED IN MG ELEMENTAL CALCIUM) 500 MG chewable tablet Chew 1 tablet by mouth as needed for indigestion or heartburn.    . clotrimazole-betamethasone (LOTRISONE) cream Apply 1 application topically 2 (two) times daily as needed (for itching).   0  . diltiazem (CARDIZEM) 30 MG tablet Take 1-2 tablets (30-60 mg total) by mouth every 4 (four) hours as needed. 60 tablet 3  . Multiple Vitamin (MULTIVITAMIN WITH MINERALS) TABS tablet Take 1 tablet by mouth 2 (two) times daily. DoTerra Vitamin    . Polyvinyl Alcohol-Povidone (REFRESH OP) Place 1 drop into both eyes as needed (for dry eyes).    . Potassium 99 MG TABS Take 1 tablet by mouth as  needed (for leg cramps in the summer once weekly).     . sildenafil (REVATIO) 20 MG tablet Take 20-60 mg by mouth daily as needed (for ED).      No current facility-administered medications for this visit.     Physical Exam: Vitals:   09/10/18 1658  BP: 126/78  Pulse: 73  SpO2: 98%  Weight: 150 lb (68 kg)  Height: 5\' 8"  (1.727 m)    GEN- The patient is well appearing, alert and oriented x 3 today.   Head- normocephalic, atraumatic Eyes-  Sclera clear, conjunctiva pink Ears- hearing  intact Oropharynx- clear Lungs- Clear to ausculation bilaterally, normal work of breathing Heart- Regular rate and rhythm, no murmurs, rubs or gallops, PMI not laterally displaced GI- soft, NT, ND, + BS Extremities- no clubbing, cyanosis, or edema  Wt Readings from Last 3 Encounters:  09/10/18 150 lb (68 kg)  08/11/18 150 lb (68 kg)  08/04/18 161 lb (73 kg)    EKG tracing ordered today is personally reviewed and shows sinus rhythm  Assessment and Plan:  1. Paroxysmal atrial fibrillation afib burden is 9.8% (8% last visit) The patient has symptomatic, recurrent paroxysmal atrial fibrillation. he has failed medical therapy and is s/p prior ablation Chads2vasc score is 0.   Therapeutic strategies for afib including medicine and ablation were discussed in detail with the patient today. Risk, benefits, and alternatives to EP study and radiofrequency ablation for afib were also discussed in detail today. These risks include but are not limited to stroke, bleeding, vascular damage, tamponade, perforation, damage to the esophagus, lungs, and other structures, pulmonary vein stenosis, worsening renal function, and death. The patient understands these risk and wishes to proceed.  We will therefore proceed with catheter ablation at the next available time.  Carto, ICE, anesthesia are requested for the procedure.  Will also obtain cardiac CT prior to the procedure to exclude LAA thrombus and further evaluate atrial anatomy.  Start xarelto at this time.  Stop asa.  2. CAD Nonobstructive Recent cath reviewed Medical management planned He declines statin but is followed in our lipid clinic  3. ETOH Avoidance encouraged Stable No change required today   Thompson Grayer MD, [facility] 09/10/2018 5:15 PM

## 2018-09-10 NOTE — Patient Instructions (Addendum)
Medication Instructions:  Your physician has recommended you make the following change in your medication:   1.  Stop taking aspirin  2.  Start taking Xarelto 20 mg -- One tablet by mouth daily with your largest meal  Labwork: You will get lab work within 30 days of your procedure:  BMP and CBC.  You are scheduled for your labs on September 16, 2018 -  Come to [facility] office lab at any time after 8:00 am you do not need to be fasting.  Check in at the front desk.  Testing/Procedures:  Your physician has requested that you have cardiac CT. Cardiac computed tomography (CT) is a painless test that uses an x-ray machine to take clear, detailed pictures of your heart. For further information please visit [email or web address]. Please follow instruction sheet as given. You will get a call from our office to schedule the date for this test.  Your physician has recommended that you have an ablation. Catheter ablation is a medical procedure used to treat some cardiac arrhythmias (irregular heartbeats). During catheter ablation, a long, thin, flexible tube is put into a blood vessel in your groin (upper thigh), or neck. This tube is called an ablation catheter. It is then guided to your heart through the blood vessel. Radio frequency waves destroy small areas of heart tissue where abnormal heartbeats may cause an arrhythmia to start. Please see the instruction sheet given to you today.  Follow-Up: You will follow up with Roderic Palau, NP with the Atrial fibrillation (Afib) clinic 4 weeks after your ablation.  You will follow up with Dr. Rayann Heman 3 months after your procedure.  CARDIAC CT INSTRUCTIONS:  Please arrive at the [facility] main entrance of [facility] at xx:xx AM (30-45 minutes prior to test start time)  [facility] [place], [place] [address] [phone]  Proceed to the [facility] Radiology Department (First Floor).  Please follow these  instructions carefully (unless otherwise directed):  Hold all erectile dysfunction medications at least 48 hours prior to test.  On the Night Before the Test: . Be sure to Drink plenty of water. . Do not consume any caffeinated/decaffeinated beverages or chocolate 12 hours prior to your test. . Do not take any antihistamines 12 hours prior to your test.  On the Day of the Test: . Drink plenty of water. Do not drink any water within one hour of the test. . Do not eat any food 4 hours prior to the test. . You may take your regular medications prior to the test.  . Take metoprolol (Lopressor) two hours prior to test.                -If HR is less than 55 BPM- No Beta Blocker                -IF HR is greater than 55 BPM and patient is less than or equal to 21 yrs old Lopressor 100mg  x1.                 After the Test: . Drink plenty of water. . After receiving IV contrast, you may experience a mild flushed feeling. This is normal. . On occasion, you may experience a mild rash up to 24 hours after the test. This is not dangerous. If this occurs, you can take Benadryl 25 mg and increase your fluid intake. . If you experience trouble breathing, this can be serious. If it is severe call  [phone] IMMEDIATELY. If it is mild, please call our office.   ABLATION INSTRUCTIONS:  Please arrive at the [facility] main entrance of [facility] hospital at: 10:00 am on October 09, 2018 Do not eat or drink after midnight prior to procedure On the morning of your procedure do not take any medications. DO NOT miss any doses of your Xarelto prior to your procedure Plan for one night stay.   You will need someone to drive you home at discharge.    Cardiac Ablation Cardiac ablation is a procedure to disable (ablate) a small amount of heart tissue in very specific places. The heart has many electrical connections. Sometimes these connections are abnormal and can cause the heart to beat very fast or irregularly.  Ablating some of the problem areas can improve the heart rhythm or return it to normal. Ablation may be done for people who:  Have Wolff-Parkinson-White syndrome.  Have fast heart rhythms (tachycardia).  Have taken medicines for an abnormal heart rhythm (arrhythmia) that were not effective or caused side effects.  Have a high-risk heartbeat that may be life-threatening.  During the procedure, a small incision is made in the neck or the groin, and a long, thin, flexible tube (catheter) is inserted into the incision and moved to the heart. Small devices (electrodes) on the tip of the catheter will send out electrical currents. A type of X-ray (fluoroscopy) will be used to help guide the catheter and to provide images of the heart. Tell a health care provider about:  Any allergies you have.  All medicines you are taking, including vitamins, herbs, eye drops, creams, and over-the-counter medicines.  Any problems you or family members have had with anesthetic medicines.  Any blood disorders you have.  Any surgeries you have had.  Any medical conditions you have, such as kidney failure.  Whether you are pregnant or may be pregnant. What are the risks? Generally, this is a safe procedure. However, problems may occur, including:  Infection.  Bruising and bleeding at the catheter insertion site.  Bleeding into the chest, especially into the sac that surrounds the heart. This is a serious complication.  Stroke or blood clots.  Damage to other structures or organs.  Allergic reaction to medicines or dyes.  Need for a permanent pacemaker if the normal electrical system is damaged. A pacemaker is a small computer that sends electrical signals to the heart and helps your heart beat normally.  The procedure not being fully effective. This may not be recognized until months later. Repeat ablation procedures are sometimes required.  What happens before the procedure?  Follow  instructions from your health care provider about eating or drinking restrictions.  Ask your health care provider about: ? Changing or stopping your regular medicines. This is especially important if you are taking diabetes medicines or blood thinners. ? Taking medicines such as aspirin and ibuprofen. These medicines can thin your blood. Do not take these medicines before your procedure if your health care provider instructs you not to.  Plan to have someone take you home from the hospital or clinic.  If you will be going home right after the procedure, plan to have someone with you for 24 hours. What happens during the procedure?  To lower your risk of infection: ? Your health care team will wash or sanitize their hands. ? Your skin will be washed with soap. ? Hair may be removed from the incision area.  An IV tube will be inserted into  one of your veins.  You will be given a medicine to help you relax (sedative).  The skin on your neck or groin will be numbed.  An incision will be made in your neck or your groin.  A needle will be inserted through the incision and into a large vein in your neck or groin.  A catheter will be inserted into the needle and moved to your heart.  Dye may be injected through the catheter to help your surgeon see the area of the heart that needs treatment.  Electrical currents will be sent from the catheter to ablate heart tissue in desired areas. There are three types of energy that may be used to ablate heart tissue: ? Heat (radiofrequency energy). ? Laser energy. ? Extreme cold (cryoablation).  When the necessary tissue has been ablated, the catheter will be removed.  Pressure will be held on the catheter insertion area to prevent excessive bleeding.  A bandage (dressing) will be placed over the catheter insertion area. The procedure may vary among health care providers and hospitals. What happens after the procedure?  Your blood pressure,  heart rate, breathing rate, and blood oxygen level will be monitored until the medicines you were given have worn off.  Your catheter insertion area will be monitored for bleeding. You will need to lie still for a few hours to ensure that you do not bleed from the catheter insertion area.  Do not drive for 24 hours or as long as directed by your health care provider. Summary  Cardiac ablation is a procedure to disable (ablate) a small amount of heart tissue in very specific places. Ablating some of the problem areas can improve the heart rhythm or return it to normal.  During the procedure, electrical currents will be sent from the catheter to ablate heart tissue in desired areas. This information is not intended to replace advice given to you by your health care provider. Make sure you discuss any questions you have with your health care provider. Document Released: 03/24/2009 Document Revised: 09/24/2016 Document Reviewed: 09/24/2016 Elsevier Interactive Patient Education  [employer].

## 2018-09-10 NOTE — H&P (View-Only) (Signed)
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  Since last being seen in our clinic, the patient reports doing very well.  Cath by Dr Tamala Julian 08/11/18 is reviewed.  He has nonobstructive CAD.  He continues to have fatigue with decreased exercise tolerance.  Today, he denies symptoms of palpitations, chest pain, shortness of breath,  lower extremity edema, dizziness, presyncope, or syncope.  The patient is otherwise without complaint today.   Past Medical History:  Diagnosis Date  . Carpal tunnel syndrome 11-03-13   bilateral numbness-worse while sleep-remains an issue  . History of kidney stones    '12 x 1 episode  . Left inguinal hernia   . Paroxysmal atrial fibrillation (HCC)   . Prostate cancer [facility])    prostate   Past Surgical History:  Procedure Laterality Date  . ATRIAL FIBRILLATION ABLATION N/A 01/22/2017   Procedure: Atrial Fibrillation Ablation;  Surgeon: Thompson Grayer, MD;  Location: [place] [facility];  Service: Cardiovascular;  Laterality: N/A;  . COLONOSCOPY  03/2006; 07/16/2011   2007: rectal adenoma 2012:Normal  . ELBOW SURGERY     bilateral ulnar nerve release  . HERNIA REPAIR Left 11/06/13   LIH  . INGUINAL HERNIA REPAIR Left 11/06/2013   Procedure: HERNIA REPAIR INGUINAL ADULT;  Surgeon: Odis Hollingshead, MD;  Location: [facility];  Service: General;  Laterality: Left;  . INSERTION OF MESH Left 11/06/2013   Procedure: INSERTION OF MESH;  Surgeon: Odis Hollingshead, MD;  Location: [facility];  Service: General;  Laterality: Left;  . KNEE ARTHROSCOPY     left  . LEFT HEART CATH AND CORONARY ANGIOGRAPHY N/A 08/11/2018   Procedure: LEFT HEART CATH AND CORONARY ANGIOGRAPHY;  Surgeon: Belva Crome, MD;  Location: [place] [facility];  Service: Cardiovascular;  Laterality: N/A;  . LOOP RECORDER INSERTION Left 11/05/2017   Procedure: LOOP RECORDER INSERTION;  Surgeon: Thompson Grayer, MD;  Location: [place] [facility];  Service: Cardiovascular;  Laterality: Left;  . PROSTATE BIOPSY  05/04/2011   adenocarcinoma Gleason 6  . ROBOT ASSISTED LAPAROSCOPIC RADICAL PROSTATECTOMY  09/24/2011   Procedure: ROBOTIC ASSISTED LAPAROSCOPIC RADICAL PROSTATECTOMY LEVEL 1;  Surgeon: Dutch Gray, MD;  Location: [facility];  Service: Urology;  Laterality: N/A;  . ULTRASOUND GUIDANCE FOR VASCULAR ACCESS  08/11/2018   Procedure: Ultrasound Guidance For Vascular Access;  Surgeon: Belva Crome, MD;  Location: [place] [facility];  Service: Cardiovascular;;    ROS- all systems are reviewed and negatives except as per HPI above  Current Outpatient Medications  Medication Sig Dispense Refill  . aspirin EC 81 MG tablet Take 81 mg by mouth daily.    . calcium carbonate (TUMS - DOSED IN MG ELEMENTAL CALCIUM) 500 MG chewable tablet Chew 1 tablet by mouth as needed for indigestion or heartburn.    . clotrimazole-betamethasone (LOTRISONE) cream Apply 1 application topically 2 (two) times daily as needed (for itching).   0  . diltiazem (CARDIZEM) 30 MG tablet Take 1-2 tablets (30-60 mg total) by mouth every 4 (four) hours as needed. 60 tablet 3  . Multiple Vitamin (MULTIVITAMIN WITH MINERALS) TABS tablet Take 1 tablet by mouth 2 (two) times daily. DoTerra Vitamin    . Polyvinyl Alcohol-Povidone (REFRESH OP) Place 1 drop into both eyes as needed (for dry eyes).    . Potassium 99 MG TABS Take 1 tablet by mouth as  needed (for leg cramps in the summer once weekly).     . sildenafil (REVATIO) 20 MG tablet Take 20-60 mg by mouth daily as needed (for ED).      No current facility-administered medications for this visit.     Physical Exam: Vitals:   09/10/18 1658  BP: 126/78  Pulse: 73  SpO2: 98%  Weight: 150 lb (68 kg)  Height: 5\' 8"  (1.727 m)    GEN- The patient is well appearing, alert and oriented x 3 today.   Head- normocephalic, atraumatic Eyes-  Sclera clear, conjunctiva pink Ears- hearing  intact Oropharynx- clear Lungs- Clear to ausculation bilaterally, normal work of breathing Heart- Regular rate and rhythm, no murmurs, rubs or gallops, PMI not laterally displaced GI- soft, NT, ND, + BS Extremities- no clubbing, cyanosis, or edema  Wt Readings from Last 3 Encounters:  09/10/18 150 lb (68 kg)  08/11/18 150 lb (68 kg)  08/04/18 161 lb (73 kg)    EKG tracing ordered today is personally reviewed and shows sinus rhythm  Assessment and Plan:  1. Paroxysmal atrial fibrillation afib burden is 9.8% (8% last visit) The patient has symptomatic, recurrent paroxysmal atrial fibrillation. he has failed medical therapy and is s/p prior ablation Chads2vasc score is 0.   Therapeutic strategies for afib including medicine and ablation were discussed in detail with the patient today. Risk, benefits, and alternatives to EP study and radiofrequency ablation for afib were also discussed in detail today. These risks include but are not limited to stroke, bleeding, vascular damage, tamponade, perforation, damage to the esophagus, lungs, and other structures, pulmonary vein stenosis, worsening renal function, and death. The patient understands these risk and wishes to proceed.  We will therefore proceed with catheter ablation at the next available time.  Carto, ICE, anesthesia are requested for the procedure.  Will also obtain cardiac CT prior to the procedure to exclude LAA thrombus and further evaluate atrial anatomy.  Start xarelto at this time.  Stop asa.  2. CAD Nonobstructive Recent cath reviewed Medical management planned He declines statin but is followed in our lipid clinic  3. ETOH Avoidance encouraged Stable No change required today   Thompson Grayer MD, [facility] 09/10/2018 5:15 PM

## 2018-09-11 LAB — CUP PACEART INCLINIC DEVICE CHECK
Implantable Pulse Generator Implant Date: 20181218
MDC IDC SESS DTM: 20191023204101

## 2018-09-16 ENCOUNTER — Other Ambulatory Visit: Payer: 59

## 2018-09-16 DIAGNOSIS — I480 Paroxysmal atrial fibrillation: Secondary | ICD-10-CM

## 2018-09-16 LAB — CBC WITH DIFFERENTIAL/PLATELET
BASOS ABS: 0 10*3/uL (ref 0.0–0.2)
Basos: 1 %
EOS (ABSOLUTE): 0.4 10*3/uL (ref 0.0–0.4)
Eos: 9 %
Hematocrit: 43.4 % (ref 37.5–51.0)
Hemoglobin: 15.2 g/dL (ref 13.0–17.7)
Immature Grans (Abs): 0 10*3/uL (ref 0.0–0.1)
Immature Granulocytes: 0 %
LYMPHS ABS: 1.8 10*3/uL (ref 0.7–3.1)
LYMPHS: 40 %
MCH: 33.2 pg — AB (ref 26.6–33.0)
MCHC: 35 g/dL (ref 31.5–35.7)
MCV: 95 fL (ref 79–97)
MONOCYTES: 11 %
Monocytes Absolute: 0.5 10*3/uL (ref 0.1–0.9)
NEUTROS ABS: 1.7 10*3/uL (ref 1.4–7.0)
Neutrophils: 39 %
PLATELETS: 187 10*3/uL (ref 150–450)
RBC: 4.58 x10E6/uL (ref 4.14–5.80)
RDW: 12.2 % — ABNORMAL LOW (ref 12.3–15.4)
WBC: 4.3 10*3/uL (ref 3.4–10.8)

## 2018-09-16 LAB — BASIC METABOLIC PANEL
BUN/Creatinine Ratio: 12 (ref 10–24)
BUN: 14 mg/dL (ref 8–27)
CALCIUM: 9.5 mg/dL (ref 8.6–10.2)
CHLORIDE: 104 mmol/L (ref 96–106)
CO2: 23 mmol/L (ref 20–29)
Creatinine, Ser: 1.13 mg/dL (ref 0.76–1.27)
GFR calc non Af Amer: 69 mL/min/{1.73_m2} (ref >59)
GFR, EST AFRICAN AMERICAN: 80 mL/min/{1.73_m2} (ref >59)
Glucose: 72 mg/dL (ref 65–99)
POTASSIUM: 4.7 mmol/L (ref 3.5–5.2)
Sodium: 142 mmol/L (ref 134–144)

## 2018-09-17 NOTE — Addendum Note (Signed)
Addended by: Willeen Cass A on: 09/17/2018 07:47 AM   Modules accepted: Orders

## 2018-10-07 ENCOUNTER — Ambulatory Visit (HOSPITAL_COMMUNITY): Admission: RE | Admit: 2018-10-07 | Payer: 59 | Source: Ambulatory Visit

## 2018-10-07 ENCOUNTER — Ambulatory Visit (HOSPITAL_COMMUNITY)
Admission: RE | Admit: 2018-10-07 | Discharge: 2018-10-07 | Disposition: A | Discharge status: Home / Self Care | Payer: 59 | Source: Ambulatory Visit | Attending: Internal Medicine | Admitting: Internal Medicine

## 2018-10-07 DIAGNOSIS — I480 Paroxysmal atrial fibrillation: Secondary | ICD-10-CM | POA: Insufficient documentation

## 2018-10-07 MED ORDER — IOPAMIDOL (ISOVUE-370) INJECTION 76%
100.0000 mL | Freq: Once | INTRAVENOUS | Status: AC | PRN
  Administered 2018-10-07: 80 mL via INTRAVENOUS

## 2018-10-09 ENCOUNTER — Ambulatory Visit (HOSPITAL_COMMUNITY): Payer: 59 | Admitting: Certified Registered Nurse Anesthetist

## 2018-10-09 ENCOUNTER — Encounter (HOSPITAL_COMMUNITY)
Admission: RE | Disposition: A | Discharge status: Home / Self Care | Payer: Self-pay | Source: Ambulatory Visit | Attending: Internal Medicine

## 2018-10-09 ENCOUNTER — Encounter (HOSPITAL_COMMUNITY): Payer: Self-pay | Admitting: Certified Registered Nurse Anesthetist

## 2018-10-09 ENCOUNTER — Other Ambulatory Visit: Payer: Self-pay

## 2018-10-09 ENCOUNTER — Ambulatory Visit (HOSPITAL_COMMUNITY)
Admission: RE | Admit: 2018-10-09 | Discharge: 2018-10-10 | Disposition: A | Discharge status: Home / Self Care | Payer: 59 | Source: Ambulatory Visit | Attending: Internal Medicine | Admitting: Internal Medicine

## 2018-10-09 DIAGNOSIS — G5603 Carpal tunnel syndrome, bilateral upper limbs: Secondary | ICD-10-CM | POA: Insufficient documentation

## 2018-10-09 DIAGNOSIS — I480 Paroxysmal atrial fibrillation: Secondary | ICD-10-CM | POA: Diagnosis present

## 2018-10-09 DIAGNOSIS — Z9889 Other specified postprocedural states: Secondary | ICD-10-CM | POA: Insufficient documentation

## 2018-10-09 DIAGNOSIS — Z7289 Other problems related to lifestyle: Secondary | ICD-10-CM | POA: Diagnosis not present

## 2018-10-09 DIAGNOSIS — Z8546 Personal history of malignant neoplasm of prostate: Secondary | ICD-10-CM | POA: Insufficient documentation

## 2018-10-09 DIAGNOSIS — I4892 Unspecified atrial flutter: Secondary | ICD-10-CM

## 2018-10-09 DIAGNOSIS — Z87442 Personal history of urinary calculi: Secondary | ICD-10-CM | POA: Diagnosis not present

## 2018-10-09 DIAGNOSIS — Z7982 Long term (current) use of aspirin: Secondary | ICD-10-CM | POA: Insufficient documentation

## 2018-10-09 DIAGNOSIS — Z9079 Acquired absence of other genital organ(s): Secondary | ICD-10-CM | POA: Insufficient documentation

## 2018-10-09 DIAGNOSIS — I2510 Atherosclerotic heart disease of native coronary artery without angina pectoris: Secondary | ICD-10-CM | POA: Insufficient documentation

## 2018-10-09 DIAGNOSIS — Z79899 Other long term (current) drug therapy: Secondary | ICD-10-CM | POA: Insufficient documentation

## 2018-10-09 DIAGNOSIS — I4819 Other persistent atrial fibrillation: Secondary | ICD-10-CM

## 2018-10-09 DIAGNOSIS — Z955 Presence of coronary angioplasty implant and graft: Secondary | ICD-10-CM | POA: Insufficient documentation

## 2018-10-09 HISTORY — PX: ATRIAL FIBRILLATION ABLATION: EP1191

## 2018-10-09 LAB — POCT ACTIVATED CLOTTING TIME: Activated Clotting Time: 142 seconds

## 2018-10-09 SURGERY — ATRIAL FIBRILLATION ABLATION
Anesthesia: General

## 2018-10-09 MED ORDER — SUGAMMADEX SODIUM 200 MG/2ML IV SOLN
INTRAVENOUS | Status: DC | PRN
  Administered 2018-10-09: 150 mg via INTRAVENOUS

## 2018-10-09 MED ORDER — RIVAROXABAN 20 MG PO TABS
20.0000 mg | ORAL_TABLET | Freq: Every day | ORAL | Status: DC
  Administered 2018-10-09: 20 mg via ORAL
  Filled 2018-10-09: qty 1

## 2018-10-09 MED ORDER — ACETAMINOPHEN 325 MG PO TABS
650.0000 mg | ORAL_TABLET | ORAL | Status: DC | PRN
  Administered 2018-10-10: 11:00:00 650 mg via ORAL
  Filled 2018-10-09: qty 2

## 2018-10-09 MED ORDER — SODIUM CHLORIDE 0.9 % IV SOLN
INTRAVENOUS | Status: DC
  Administered 2018-10-09: 11:00:00 via INTRAVENOUS

## 2018-10-09 MED ORDER — PHENOL 1.4 % MT LIQD
1.0000 | OROMUCOSAL | Status: DC | PRN
  Filled 2018-10-09: qty 177

## 2018-10-09 MED ORDER — ADENOSINE 6 MG/2ML IV SOLN
INTRAVENOUS | Status: AC
  Filled 2018-10-09: qty 4

## 2018-10-09 MED ORDER — ADENOSINE 6 MG/2ML IV SOLN
INTRAVENOUS | Status: AC
  Filled 2018-10-09: qty 2

## 2018-10-09 MED ORDER — HEPARIN SODIUM (PORCINE) 1000 UNIT/ML IJ SOLN
INTRAMUSCULAR | Status: DC | PRN
  Administered 2018-10-09: 12000 [IU] via INTRAVENOUS
  Administered 2018-10-09: 1000 [IU] via INTRAVENOUS

## 2018-10-09 MED ORDER — PROTAMINE SULFATE 10 MG/ML IV SOLN
INTRAVENOUS | Status: DC | PRN
  Administered 2018-10-09: 10 mg via INTRAVENOUS
  Administered 2018-10-09: 20 mg via INTRAVENOUS
  Administered 2018-10-09: 10 mg via INTRAVENOUS

## 2018-10-09 MED ORDER — ONDANSETRON HCL 4 MG/2ML IJ SOLN: 4.0000 mg | Freq: Four times a day (QID) | INTRAMUSCULAR | Status: DC | PRN

## 2018-10-09 MED ORDER — ONDANSETRON HCL 4 MG/2ML IJ SOLN
INTRAMUSCULAR | Status: DC | PRN
  Administered 2018-10-09: 4 mg via INTRAVENOUS

## 2018-10-09 MED ORDER — SODIUM CHLORIDE 0.9 % IV SOLN
INTRAVENOUS | Status: DC | PRN
  Administered 2018-10-09: 20 ug/min via INTRAVENOUS

## 2018-10-09 MED ORDER — SODIUM CHLORIDE 0.9% FLUSH: 3.0000 mL | Freq: Two times a day (BID) | INTRAVENOUS | Status: DC

## 2018-10-09 MED ORDER — SODIUM CHLORIDE 0.9 % IV SOLN: 250.0000 mL | INTRAVENOUS | Status: DC | PRN

## 2018-10-09 MED ORDER — PROPOFOL 10 MG/ML IV BOLUS
INTRAVENOUS | Status: DC | PRN
  Administered 2018-10-09: 150 mg via INTRAVENOUS

## 2018-10-09 MED ORDER — DILTIAZEM HCL 60 MG PO TABS
60.0000 mg | ORAL_TABLET | Freq: Four times a day (QID) | ORAL | Status: DC
  Administered 2018-10-09 – 2018-10-10 (×3): 60 mg via ORAL
  Filled 2018-10-09 (×5): qty 1

## 2018-10-09 MED ORDER — HEPARIN (PORCINE) IN NACL 1000-0.9 UT/500ML-% IV SOLN
INTRAVENOUS | Status: AC
  Filled 2018-10-09: qty 500

## 2018-10-09 MED ORDER — BUPIVACAINE HCL (PF) 0.25 % IJ SOLN
INTRAMUSCULAR | Status: DC | PRN
  Administered 2018-10-09: 30 mL

## 2018-10-09 MED ORDER — MIDAZOLAM HCL 5 MG/5ML IJ SOLN
INTRAMUSCULAR | Status: DC | PRN
  Administered 2018-10-09: 2 mg via INTRAVENOUS

## 2018-10-09 MED ORDER — HEPARIN (PORCINE) IN NACL 1000-0.9 UT/500ML-% IV SOLN
INTRAVENOUS | Status: DC | PRN
  Administered 2018-10-09: 500 mL

## 2018-10-09 MED ORDER — BUPIVACAINE HCL (PF) 0.25 % IJ SOLN
INTRAMUSCULAR | Status: AC
  Filled 2018-10-09: qty 30

## 2018-10-09 MED ORDER — HEPARIN SODIUM (PORCINE) 1000 UNIT/ML IJ SOLN
INTRAMUSCULAR | Status: AC
  Filled 2018-10-09: qty 1

## 2018-10-09 MED ORDER — HYDROCODONE-ACETAMINOPHEN 5-325 MG PO TABS: 1.0000 | ORAL_TABLET | ORAL | Status: DC | PRN

## 2018-10-09 MED ORDER — ROCURONIUM BROMIDE 50 MG/5ML IV SOSY
PREFILLED_SYRINGE | INTRAVENOUS | Status: DC | PRN
  Administered 2018-10-09: 50 mg via INTRAVENOUS

## 2018-10-09 MED ORDER — LIDOCAINE 2% (20 MG/ML) 5 ML SYRINGE
INTRAMUSCULAR | Status: DC | PRN
  Administered 2018-10-09: 80 mg via INTRAVENOUS

## 2018-10-09 MED ORDER — FENTANYL CITRATE (PF) 100 MCG/2ML IJ SOLN
INTRAMUSCULAR | Status: DC | PRN
  Administered 2018-10-09: 100 ug via INTRAVENOUS

## 2018-10-09 MED ORDER — SODIUM CHLORIDE 0.9% FLUSH: 3.0000 mL | INTRAVENOUS | Status: DC | PRN

## 2018-10-09 MED ORDER — HEPARIN SODIUM (PORCINE) 1000 UNIT/ML IJ SOLN
INTRAMUSCULAR | Status: DC | PRN
  Administered 2018-10-09: 4000 [IU] via INTRAVENOUS
  Administered 2018-10-09: 1000 [IU] via INTRAVENOUS

## 2018-10-09 SURGICAL SUPPLY — 18 items
BLANKET WARM UNDERBOD FULL ACC (MISCELLANEOUS) ×1 IMPLANT
CATH MAPPNG PENTARAY F 2-6-2MM (CATHETERS) IMPLANT
CATH NAVISTAR SMARTTOUCH DF (ABLATOR) ×1 IMPLANT
CATH SOUNDSTAR ECO REPROCESSED (CATHETERS) ×1 IMPLANT
CATH WEBSTER BI DIR CS D-F CRV (CATHETERS) ×1 IMPLANT
COVER SWIFTLINK CONNECTOR (BAG) ×1 IMPLANT
NDL BAYLIS TRANSSEPTAL 71CM (NEEDLE) IMPLANT
NEEDLE BAYLIS TRANSSEPTAL 71CM (NEEDLE) ×2 IMPLANT
PACK EP LATEX FREE (CUSTOM PROCEDURE TRAY) ×2
PACK EP LF (CUSTOM PROCEDURE TRAY) ×1 IMPLANT
PAD PRO RADIOLUCENT 2001M-C (PAD) ×2 IMPLANT
PATCH CARTO3 (PAD) ×1 IMPLANT
PENTARAY F 2-6-2MM (CATHETERS) ×2
SHEATH AVANTI 11F 11CM (SHEATH) ×1 IMPLANT
SHEATH PINNACLE 7F 10CM (SHEATH) ×2 IMPLANT
SHEATH PINNACLE 9F 10CM (SHEATH) ×1 IMPLANT
SHEATH SWARTZ TS SL2 63CM 8.5F (SHEATH) ×1 IMPLANT
TUBING SMART ABLATE COOLFLOW (TUBING) ×1 IMPLANT

## 2018-10-09 NOTE — Anesthesia Postprocedure Evaluation (Signed)
Anesthesia Post Note  Patient: Ricardo [name]  Procedure(s) Performed: ATRIAL FIBRILLATION ABLATION (N/A )     Patient location during evaluation: PACU Anesthesia Type: General Level of consciousness: awake and alert Pain management: pain level controlled Vital Signs Assessment: post-procedure vital signs reviewed and stable Respiratory status: spontaneous breathing, nonlabored ventilation, respiratory function stable and patient connected to nasal cannula oxygen Cardiovascular status: blood pressure returned to baseline and stable Postop Assessment: no apparent nausea or vomiting Anesthetic complications: no    Last Vitals:  Vitals:   10/09/18 1610 10/09/18 1630  BP: 120/86 127/71  Pulse:  (!) 59  Resp: 14 16  Temp:  36.4 C  SpO2: 95% 95%    Last Pain:  Vitals:   10/09/18 1630  TempSrc: Oral  PainSc:                  Ryan P Ellender

## 2018-10-09 NOTE — Interval H&P Note (Signed)
History and Physical Interval Note:  10/09/2018 12:05 PM  Ricardo [name]  has presented today for surgery, with the diagnosis of afib  The various methods of treatment have been discussed with the patient and family. After consideration of risks, benefits and other options for treatment, the patient has consented to  Procedure(s): ATRIAL FIBRILLATION ABLATION (N/A) as a surgical intervention .  The patient's history has been reviewed, patient examined, no change in status, stable for surgery.  I have reviewed the patient's chart and labs.  Questions were answered to the patient's satisfaction.    Cardiac CT reviewed with patient today.  He reports compliance with xarelto without interruption.  Thompson Grayer MD, [place] 10/09/2018 12:06 PM

## 2018-10-09 NOTE — Discharge Summary (Addendum)
ELECTROPHYSIOLOGY PROCEDURE DISCHARGE SUMMARY    Patient ID: Ricardo [name],  MRN: [id number], DOB/AGE: 06-14-1956 62 y.o.  Admit date: 10/09/2018 Discharge date: 10/10/18  Primary Care Physician: Leda Quail., MD  Electrophysiologist: Thompson Grayer, MD  Primary Discharge Diagnosis:  1. Paroxysmal Afib     CHA2DS2Vasc is zero, on xarelto, appropriately dosed  Secondary Discharge Diagnosis:  1. CAD (non-obstructive) 2. ETOH use  Procedures This Admission:  1.  Electrophysiology [name] and radiofrequency catheter ablation on 10/09/18 by Dr Thompson Grayer.   This [name] demonstrated     Brief HPI: Ricardo [name] is a 62 y.o. male with a history of paroxysmal atrial fibrillation.  Risks, benefits, and alternatives to catheter ablation of atrial fibrillation were reviewed with the patient who wished to proceed.  The patient underwent cardiac CT prior to the procedure which demonstrated no LAA thrombus.    Hospital Course:  The patient was admitted and underwent EPS/RFCA of atrial fibrillation with details as outlined above.  They were monitored on telemetry overnight which demonstrated SR.  R groin was without complication on the day of discharge.  The patient feels well, only slight pleuritic discomfort, no SOB, or site discomfort.  He was examined by Dr.Jahn Franchini and considered to be stable for discharge.  Wound care and restrictions were reviewed with the patient.  The patient will be seen back by Roderic Palau, NP in 4 weeks and Dr Rayann Heman in 12 weeks for post ablation follow up.     Physical Exam: Vitals:   10/09/18 2200 10/09/18 2335 10/10/18 0500 10/10/18 0757  BP: (!) 98/44 127/70 98/63 (!) 122/92  Pulse: 61  (!) 55 (!) 52  Resp: 16  17 11   Temp:   98 F (36.7 C) 98 F (36.7 C)  TempSrc:   Oral Oral  SpO2: 95%  96% 96%  Weight:   70.4 kg   Height:        GEN- The patient is well appearing, alert and oriented x 3 today.   HEENT: normocephalic, atraumatic;  sclera clear, conjunctiva pink; hearing intact; oropharynx clear; neck supple  Lungs- CTA b/l, normal work of breathing.  No wheezes, rales, rhonchi Heart- RRR, no murmurs, rubs or gallops  GI- soft, non-tender, non-distended  Extremities- no clubbing, cyanosis, or edema; DP/PT pulses 2+ bilaterally, R groin without hematoma/bruit MS- no significant deformity or atrophy Skin- warm and dry, no rash or lesion Psych- euthymic mood, full affect Neuro- strength and sensation are intact   Labs:   Lab Results  Component Value Date   WBC 4.3 09/16/2018   HGB 15.2 09/16/2018   HCT 43.4 09/16/2018   MCV 95 09/16/2018   PLT 187 09/16/2018   No results for input(s): NA, K, CL, CO2, BUN, CREATININE, CALCIUM, PROT, BILITOT, ALKPHOS, ALT, AST, GLUCOSE in the last 168 hours.  Invalid input(s): LABALBU   Discharge Medications:  Allergies as of 10/10/2018   No Known Allergies     Medication List    TAKE these medications   calcium carbonate 500 MG chewable tablet Commonly known as:  TUMS - dosed in mg elemental calcium Chew 1 tablet by mouth as needed for indigestion or heartburn.   clotrimazole-betamethasone cream Commonly known as:  LOTRISONE Apply 1 application topically 2 (two) times daily as needed (for itching).   [employer] ACID REDUCER PO Take 1-2 tablets by mouth daily as needed (for acid reducer.). Notes to patient:  Ok to resume after you have finished the pantoprazole as  discussed   diltiazem 30 MG tablet Commonly known as:  CARDIZEM Take 1-2 tablets (30-60 mg total) by mouth every 4 (four) hours as needed. What changed:  reasons to take this   metoprolol tartrate 100 MG tablet Commonly known as:  LOPRESSOR Take 1 tablet (100 mg total) by mouth once for 1 dose.   multivitamin with minerals Tabs tablet Take 1 tablet by mouth 2 (two) times daily. DoTerra Vitamin   pantoprazole 40 MG tablet Commonly known as:  PROTONIX Take 1 tablet (40 mg total) by mouth daily.     Potassium 99 MG Tabs Take 99 mg by mouth daily as needed (for leg cramps in the summer once weekly).   REFRESH OP Place 1 drop into both eyes as needed (for dry eyes).   rivaroxaban 20 MG Tabs tablet Commonly known as:  XARELTO Take 1 tablet (20 mg total) by mouth daily with supper.   sildenafil 20 MG tablet Commonly known as:  REVATIO Take 20-60 mg by mouth daily as needed (for ED).       Disposition:  Home  Discharge Instructions    Diet - low sodium heart healthy   Complete by:  As directed    Increase activity slowly   Complete by:  As directed      Follow-up Information    [facility] [place] Follow up.   Specialty:  Cardiology Why:  10/15/18 @ 9:00AM Contact information: [address] [id number] [place] [address] [phone]       Thompson Grayer, MD Follow up.   Specialty:  Cardiology Why:  01/12/19 @ 9:45AM Contact information: [place] [address] [phone]           Duration of Discharge Encounter: Greater than 30 minutes including physician time.  SignedTommye Standard, PA-C 10/10/2018 10:42 AM  I have seen, examined the patient, and reviewed the above assessment and plan.  Changes to above are made where necessary.  On exam, RRR.  Doing very well s/p ablation .  DC to home with routine wound care and follow-up  Co Sign: Thompson Grayer, MD 10/10/2018 10:47 PM

## 2018-10-09 NOTE — Anesthesia Procedure Notes (Signed)
Procedure Name: Intubation Date/Time: 10/09/2018 12:38 PM Performed by: Candis Shine, CRNA Pre-anesthesia Checklist: Patient identified, Emergency Drugs available, Suction available and Patient being monitored Patient Re-evaluated:Patient Re-evaluated prior to induction Oxygen Delivery Method: Circle System Utilized Preoxygenation: Pre-oxygenation with 100% oxygen Induction Type: IV induction Ventilation: Mask ventilation without difficulty and Oral airway inserted - appropriate to patient size Laryngoscope Size: Mac and 4 Grade View: Grade II Tube type: Oral Tube size: 7.5 mm Number of attempts: 1 Airway Equipment and Method: Stylet and Oral airway Placement Confirmation: ETT inserted through vocal cords under direct vision,  positive ETCO2 and breath sounds checked- equal and bilateral Secured at: 23 cm Tube secured with: Tape Dental Injury: Teeth and Oropharynx as per pre-operative assessment

## 2018-10-09 NOTE — Progress Notes (Addendum)
Site area: RFV x 3 Site Prior to Removal:  Level 0 Pressure Applied For:25 min  Manual:   yes Patient Status During Pull:  stable Post Pull Site:  Level 0 Post Pull Instructions Given:  yes Post Pull Pulses Present: palpable Dressing Applied:  clear Bedrest begins @ 5974 till 2215 Comments: removed by Dalbert Batman

## 2018-10-09 NOTE — Anesthesia Preprocedure Evaluation (Signed)
Anesthesia Evaluation  Patient identified by MRN, date of birth, ID band Patient awake    Reviewed: Allergy & Precautions, NPO status , Patient's Chart, lab work & pertinent test results  Airway Mallampati: II  TM Distance: >3 FB Neck ROM: Full    Dental no notable dental hx.    Pulmonary neg pulmonary ROS, former smoker,    Pulmonary exam normal breath sounds clear to auscultation       Cardiovascular + dysrhythmias Atrial Fibrillation  Rhythm:Irregular Rate:Normal     Neuro/Psych negative neurological ROS  negative psych ROS   GI/Hepatic negative GI ROS, Neg liver ROS,   Endo/Other  negative endocrine ROS  Renal/GU negative Renal ROS  negative genitourinary   Musculoskeletal negative musculoskeletal ROS (+)   Abdominal   Peds negative pediatric ROS (+)  Hematology negative hematology ROS (+)   Anesthesia Other Findings   Reproductive/Obstetrics negative OB ROS                             Anesthesia Physical  Anesthesia Plan  ASA: III  Anesthesia Plan: General   Post-op Pain Management:    Induction: Intravenous  PONV Risk Score and Plan: 2 and Ondansetron and Midazolam  Airway Management Planned: Oral ETT  Additional Equipment:   Intra-op Plan:   Post-operative Plan: Extubation in OR  Informed Consent: I have reviewed the patients History and Physical, chart, labs and discussed the procedure including the risks, benefits and alternatives for the proposed anesthesia with the patient or authorized representative who has indicated his/her understanding and acceptance.   Dental advisory given  Plan Discussed with: CRNA and Surgeon  Anesthesia Plan Comments:         Anesthesia Quick Evaluation

## 2018-10-09 NOTE — Transfer of Care (Signed)
Immediate Anesthesia Transfer of Care Note  Patient: Ricardo [name]  Procedure(s) Performed: ATRIAL FIBRILLATION ABLATION (N/A )  Patient Location: Cath Lab  Anesthesia Type:General  Level of Consciousness: awake, alert  and oriented  Airway & Oxygen Therapy: Patient Spontanous Breathing and Patient connected to nasal cannula oxygen  Post-op Assessment: Report given to RN and Post -op Vital signs reviewed and stable  Post vital signs: Reviewed and stable  Last Vitals:  Vitals Value Taken Time  BP 124/81 10/09/2018  3:35 PM  Temp    Pulse 58 10/09/2018  3:36 PM  Resp 14 10/09/2018  3:36 PM  SpO2 99 % 10/09/2018  3:36 PM  Vitals shown include unvalidated device data.  Last Pain:  Vitals:   10/09/18 1045  PainSc: 0-No pain      Patients Stated Pain Goal: 5 (89/48/34 7583)  Complications: No apparent anesthesia complications

## 2018-10-10 ENCOUNTER — Encounter (HOSPITAL_COMMUNITY): Payer: Self-pay | Admitting: Internal Medicine

## 2018-10-10 DIAGNOSIS — G5603 Carpal tunnel syndrome, bilateral upper limbs: Secondary | ICD-10-CM | POA: Diagnosis not present

## 2018-10-10 DIAGNOSIS — I480 Paroxysmal atrial fibrillation: Secondary | ICD-10-CM | POA: Diagnosis not present

## 2018-10-10 DIAGNOSIS — I2510 Atherosclerotic heart disease of native coronary artery without angina pectoris: Secondary | ICD-10-CM | POA: Diagnosis not present

## 2018-10-10 DIAGNOSIS — Z7982 Long term (current) use of aspirin: Secondary | ICD-10-CM | POA: Diagnosis not present

## 2018-10-10 LAB — POCT ACTIVATED CLOTTING TIME
Activated Clotting Time: 296 seconds
Activated Clotting Time: 362 seconds

## 2018-10-10 MED ORDER — OFF THE BEAT BOOK
Freq: Once | Status: AC
  Administered 2018-10-10: 06:00:00
  Filled 2018-10-10: qty 1

## 2018-10-10 MED ORDER — PANTOPRAZOLE SODIUM 40 MG PO TBEC: 40.0000 mg | DELAYED_RELEASE_TABLET | Freq: Every day | ORAL | 0 refills | Status: DC

## 2018-10-10 NOTE — Discharge Instructions (Signed)
Post procedure care instructions No driving for 4 days. No lifting over 5 lbs for 1 week. No vigorous or  sexual activity for 1 week. You may return to work on 10/16/18. Keep procedure site clean & dry. If you notice increased pain, swelling, bleeding or pus, call/return!  You may shower, but no soaking baths/hot tubs/pools for 1 week.   You have an appointment set up with the [facility].  Multiple studies have shown that being followed by a dedicated atrial fibrillation clinic in addition to the standard care you receive from your other physicians improves health. We believe that enrollment in the atrial fibrillation clinic will allow [place] to better care for you.   The phone number to the [facility] is [phone]. The clinic is staffed Monday through Friday from 8:30am to 5pm.  Parking Directions: The clinic is located in the Heart and Vascular Building connected to [facility]. 1)From [address] turn on to [address] and go to the 3rd entrance  (Heart and Vascular entrance) on the right. 2)Look to the right for Heart &Vascular Parking Garage. 3)A code for the entrance is required please call the clinic to receive this.   4)Take the elevators to the 1st floor. Registration is in the room with the glass walls at the end of the hallway.  If you have any trouble parking or locating the clinic, please dont hesitate to call [phone].

## 2018-10-15 ENCOUNTER — Encounter (HOSPITAL_COMMUNITY): Payer: Self-pay | Admitting: Nurse Practitioner

## 2018-10-15 ENCOUNTER — Ambulatory Visit (HOSPITAL_COMMUNITY)
Admission: RE | Admit: 2018-10-15 | Discharge: 2018-10-15 | Disposition: A | Discharge status: Home / Self Care | Payer: 59 | Source: Ambulatory Visit | Attending: Nurse Practitioner | Admitting: Nurse Practitioner

## 2018-10-15 VITALS — BP 136/84 | HR 69 | Ht 68.0 in | Wt 156.0 lb

## 2018-10-15 DIAGNOSIS — I480 Paroxysmal atrial fibrillation: Secondary | ICD-10-CM

## 2018-10-15 DIAGNOSIS — Z7901 Long term (current) use of anticoagulants: Secondary | ICD-10-CM | POA: Insufficient documentation

## 2018-10-15 DIAGNOSIS — Z8546 Personal history of malignant neoplasm of prostate: Secondary | ICD-10-CM | POA: Diagnosis not present

## 2018-10-15 DIAGNOSIS — Z8042 Family history of malignant neoplasm of prostate: Secondary | ICD-10-CM | POA: Diagnosis not present

## 2018-10-15 DIAGNOSIS — Z9889 Other specified postprocedural states: Secondary | ICD-10-CM | POA: Diagnosis not present

## 2018-10-15 DIAGNOSIS — Z87891 Personal history of nicotine dependence: Secondary | ICD-10-CM | POA: Insufficient documentation

## 2018-10-15 NOTE — Progress Notes (Signed)
Primary Care Physician: Leda Quail., MD Referring Physician: Dr. Lyn Hollingshead Ricardo [name] is a 62 y.o. male with a h/o 2nd ablation one week ago. He was suppose to see me at one month but apparently this appointment was made in error. He states that he feels ok. No swallowing or groin issues. He is in SR. Has noted some quivering of his heart, but no racing.   Today, he denies symptoms of palpitations, chest pain, shortness of breath, orthopnea, PND, lower extremity edema, dizziness, presyncope, syncope, or neurologic sequela. The patient is tolerating medications without difficulties and is otherwise without complaint today.   Past Medical History:  Diagnosis Date  . Carpal tunnel syndrome 11-03-13   bilateral numbness-worse while sleep-remains an issue  . History of kidney stones    '12 x 1 episode  . Left inguinal hernia   . Paroxysmal atrial fibrillation (HCC)   . Prostate cancer [facility])    prostate   Past Surgical History:  Procedure Laterality Date  . ATRIAL FIBRILLATION ABLATION N/A 01/22/2017   Procedure: Atrial Fibrillation Ablation;  Surgeon: Thompson Grayer, MD;  Location: [place] [facility];  Service: Cardiovascular;  Laterality: N/A;  . ATRIAL FIBRILLATION ABLATION  10/09/2018  . ATRIAL FIBRILLATION ABLATION N/A 10/09/2018   Procedure: ATRIAL FIBRILLATION ABLATION;  Surgeon: Thompson Grayer, MD;  Location: [place] [facility];  Service: Cardiovascular;  Laterality: N/A;  . COLONOSCOPY  03/2006; 07/16/2011   2007: rectal adenoma 2012:Normal  . ELBOW SURGERY     bilateral ulnar nerve release  . HERNIA REPAIR Left 11/06/13   LIH  . INGUINAL HERNIA REPAIR Left 11/06/2013   Procedure: HERNIA REPAIR INGUINAL ADULT;  Surgeon: Odis Hollingshead, MD;  Location: [facility];  Service: General;  Laterality: Left;  . INSERTION OF MESH Left 11/06/2013   Procedure: INSERTION OF MESH;  Surgeon: Odis Hollingshead, MD;  [facility]: [facility];  Service: General;  Laterality: Left;  . KNEE  ARTHROSCOPY     left  . LEFT HEART CATH AND CORONARY ANGIOGRAPHY N/A 08/11/2018   Procedure: LEFT HEART CATH AND CORONARY ANGIOGRAPHY;  Surgeon: Belva Crome, MD;  Location: [place] [facility];  Service: Cardiovascular;  Laterality: N/A;  . LOOP RECORDER INSERTION Left 11/05/2017   Procedure: LOOP RECORDER INSERTION;  Surgeon: Thompson Grayer, MD;  Location: [place] [facility];  Service: Cardiovascular;  Laterality: Left;  . PROSTATE BIOPSY  05/04/2011   adenocarcinoma Gleason 6  . ROBOT ASSISTED LAPAROSCOPIC RADICAL PROSTATECTOMY  09/24/2011   Procedure: ROBOTIC ASSISTED LAPAROSCOPIC RADICAL PROSTATECTOMY LEVEL 1;  Surgeon: Dutch Gray, MD;  Location: [facility];  Service: Urology;  Laterality: N/A;  . ULTRASOUND GUIDANCE FOR VASCULAR ACCESS  08/11/2018   Procedure: Ultrasound Guidance For Vascular Access;  Surgeon: Belva Crome, MD;  Location: [place] [facility];  Service: Cardiovascular;;    Current Outpatient Medications  Medication Sig Dispense Refill  . clotrimazole-betamethasone (LOTRISONE) cream Apply 1 application topically 2 (two) times daily as needed (for itching).   0  . diltiazem (CARDIZEM) 30 MG tablet Take 1-2 tablets (30-60 mg total) by mouth every 4 (four) hours as needed. 60 tablet 3  . Multiple Vitamin (MULTIVITAMIN WITH MINERALS) TABS tablet Take 1 tablet by mouth 2 (two) times daily. DoTerra Vitamin    . pantoprazole (PROTONIX) 40 MG tablet Take 1 tablet (40 mg total) by mouth daily. 45 tablet 0  . Polyvinyl Alcohol-Povidone (REFRESH OP) Place 1 drop into both eyes as needed (for  dry eyes).    . raNITIdine HCl ([employer]S ACID REDUCER PO) Take 1-2 tablets by mouth daily as needed (for acid reducer.).    Marland Kitchen rivaroxaban (XARELTO) 20 MG TABS tablet Take 1 tablet (20 mg total) by mouth daily with supper. 30 tablet 11  . sildenafil (REVATIO) 20 MG tablet Take 20-60 mg by mouth daily as needed (for ED).     . calcium carbonate (TUMS - DOSED IN MG ELEMENTAL CALCIUM) 500 MG chewable tablet  Chew 1 tablet by mouth as needed for indigestion or heartburn.    . Potassium 99 MG TABS Take 99 mg by mouth daily as needed (for leg cramps in the summer once weekly).      No current facility-administered medications for this encounter.     No Known Allergies  Social History   Socioeconomic History  . Marital status: Divorced    Spouse name: Not on file  . Number of children: Not on file  . Years of education: Not on file  . Highest education level: Not on file  Occupational History  . Not on file  Social Needs  . Financial resource strain: Not on file  . Food insecurity:    Worry: Not on file    Inability: Not on file  . Transportation needs:    Medical: Not on file    Non-medical: Not on file  Tobacco Use  . Smoking status: Former Smoker    Last attempt to quit: 09/17/2001    Years since quitting: 17.0  . Smokeless tobacco: Never Used  Substance and Sexual Activity  . Alcohol use: Yes    Alcohol/week: 12.0 standard drinks    Types: 5 Glasses of wine, 7 Cans of beer per week    Comment: daily  . Drug use: No  . Sexual activity: Yes  Lifestyle  . Physical activity:    Days per week: Not on file    Minutes per session: Not on file  . Stress: Not on file  Relationships  . Social connections:    Talks on phone: Not on file    Gets together: Not on file    Attends religious service: Not on file    Active member of club or organization: Not on file    Attends meetings of clubs or organizations: Not on file    Relationship status: Not on file  . Intimate partner violence:    Fear of current or ex partner: Not on file    Emotionally abused: Not on file    Physically abused: Not on file    Forced sexual activity: Not on file  Other Topics Concern  . 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 Prostate cancer Father     ROS- All systems are reviewed and  negative except as per the HPI above  Physical Exam: Vitals:   10/15/18 0908  BP: 136/84  Pulse: 69  Weight: 70.8 kg  Height: 5\' 8"  (1.727 m)   Wt Readings from Last 3 Encounters:  10/15/18 70.8 kg  10/10/18 70.4 kg  09/10/18 68 kg    Labs: Lab Results  Component Value Date   NA 142 09/16/2018   K 4.7 09/16/2018   CL 104 09/16/2018   CO2 23 09/16/2018   GLUCOSE 72 09/16/2018   BUN 14 09/16/2018   CREATININE 1.13 09/16/2018  CALCIUM 9.5 09/16/2018   Lab Results  Component Value Date   INR 0.99 11/03/2013   Lab Results  Component Value Date   CHOL 171 08/13/2018   HDL 69 08/13/2018   LDLCALC 87 08/13/2018   TRIG 74 08/13/2018     GEN- The patient is well appearing, alert and oriented x 3 today.   Head- normocephalic, atraumatic Eyes-  Sclera clear, conjunctiva pink Ears- hearing intact Oropharynx- clear Neck- supple, no JVP Lymph- no cervical lymphadenopathy Lungs- Clear to ausculation bilaterally, normal work of breathing Heart- Regular rate and rhythm, no murmurs, rubs or gallops, PMI not laterally displaced GI- soft, NT, ND, + BS Extremities- no clubbing, cyanosis, or edema MS- no significant deformity or atrophy Skin- no rash or lesion Psych- euthymic mood, full affect Neuro- strength and sensation are intact  EKG-NSR at 69 bpm, pr int 128 ms, qrs int 90 ms, qtc 437 ms    Assessment and Plan: 1. Paroxysmal afib  Second ablation one week ago Doing well, staying in SR No early complications evident Continue xarelto for a chadsvasc score of 0 without interruption Increase activities back to normal over the next few weeks Plans to return to week starting next week   F/u in 3 weeks for EKG to assure SR F/u with Dr. Rayann Heman 2/24  Geroge Baseman. [place], [facility] [address] [place], [place] [address] [phone]

## 2018-10-21 ENCOUNTER — Other Ambulatory Visit: Payer: 59

## 2018-10-21 DIAGNOSIS — E785 Hyperlipidemia, unspecified: Secondary | ICD-10-CM

## 2018-10-21 LAB — LIPID PANEL
CHOLESTEROL TOTAL: 177 mg/dL (ref 100–199)
Chol/HDL Ratio: 2.6 ratio (ref 0.0–5.0)
HDL: 67 mg/dL (ref >39)
LDL CALC: 97 mg/dL (ref 0–99)
TRIGLYCERIDES: 67 mg/dL (ref 0–149)
VLDL Cholesterol Cal: 13 mg/dL (ref 5–40)

## 2018-10-23 ENCOUNTER — Ambulatory Visit (INDEPENDENT_AMBULATORY_CARE_PROVIDER_SITE_OTHER): Payer: 59 | Admitting: Pharmacist

## 2018-10-23 DIAGNOSIS — E785 Hyperlipidemia, unspecified: Secondary | ICD-10-CM

## 2018-10-23 DIAGNOSIS — I25709 Atherosclerosis of coronary artery bypass graft(s), unspecified, with unspecified angina pectoris: Secondary | ICD-10-CM

## 2018-10-23 MED ORDER — ROSUVASTATIN CALCIUM 10 MG PO TABS: 10.0000 mg | ORAL_TABLET | Freq: Every day | ORAL | 3 refills | Status: DC

## 2018-10-23 NOTE — Patient Instructions (Signed)
It was nice to see you today  Your LDL goal is < 70  Start taking rosuvastatin (Crestor) 10mg  once a day  Recheck cholesterol in 3 months.

## 2018-10-23 NOTE — Progress Notes (Signed)
Patient ID: ADEEB KONECNY                 DOB: Jan 22, 1956                    MRN: [id number]     HPI: Ricardo [name] is a 62 y.o. male patient of Dr. Rayann Heman that presents today for lipid follow up.  PMH is significant for CAD and afib. Pt underwent cardiac cath on 08/11/18 to evaluate episodes of SOB. Cath revealed 40-50% blockage in LAD and it was recommended to proceed with aggressive risk factor modification. He was seen in lipid clinic 2 months ago but did not want to start on any medications at that time. He preferred to focus on lifestyle changes. F/u lipid panel showed worse cholesterol with LDL increase from 87 to 97.  He presents today for discussion of cholesterol. He states that he has previously controlled his cholesterol with diet and exercise. He has never tried a statin medication, but has heard that they can change blood sugars and cause joint aches. He has worked on eating more chicken and less beef in the past few months. He is watching his portion sizes and has cut back on using butter (using more olive oil now). He had an ablation 2 weeks ago and has been very busy with work at [employer] with 10 hour shifts. He has not been able to go to the gym because of his demanding work schedule.  Current Medications: none Intolerances: none Risk Factors: CAD with 40-50% blockage of LAD LDL Goal: 70mg /dL  Diet: Eats at home and restaurants. He eats sandwiches (roast beef, reuben). He does eat a vegetable plate or salad once a week. He eats red meat and chicken about equal. He admits he could be better with vegetables. He prefers home grown vegetables. He does cook with butter. He drinks mostly water.   Exercise: He has not been exercising. He plans to go back to the gym this week.   Family History: Mom with pacemaker  Social History: He drinks a beer or mixed drink each night. Former smoker  Labs: 10/21/18: TC 177, TG 67, HDL 67, LDL 97 (no therapy) 08/13/18:  TC 171, TG 74, HDL 69,  LDL 87 (no therapy)  Past Medical History:  Diagnosis Date  . Carpal tunnel syndrome 11-03-13   bilateral numbness-worse while sleep-remains an issue  . History of kidney stones    '12 x 1 episode  . Left inguinal hernia   . Paroxysmal atrial fibrillation (HCC)   . Prostate cancer [facility])    prostate    Current Outpatient Medications on File Prior to Visit  Medication Sig Dispense Refill  . calcium carbonate (TUMS - DOSED IN MG ELEMENTAL CALCIUM) 500 MG chewable tablet Chew 1 tablet by mouth as needed for indigestion or heartburn.    . clotrimazole-betamethasone (LOTRISONE) cream Apply 1 application topically 2 (two) times daily as needed (for itching).   0  . diltiazem (CARDIZEM) 30 MG tablet Take 1-2 tablets (30-60 mg total) by mouth every 4 (four) hours as needed. 60 tablet 3  . Multiple Vitamin (MULTIVITAMIN WITH MINERALS) TABS tablet Take 1 tablet by mouth 2 (two) times daily. DoTerra Vitamin    . pantoprazole (PROTONIX) 40 MG tablet Take 1 tablet (40 mg total) by mouth daily. 45 tablet 0  . Polyvinyl Alcohol-Povidone (REFRESH OP) Place 1 drop into both eyes as needed (for dry eyes).    . Potassium 99  MG TABS Take 99 mg by mouth daily as needed (for leg cramps in the summer once weekly).     . raNITIdine HCl (CVS ACID REDUCER PO) Take 1-2 tablets by mouth daily as needed (for acid reducer.).    Marland Kitchen rivaroxaban (XARELTO) 20 MG TABS tablet Take 1 tablet (20 mg total) by mouth daily with supper. 30 tablet 11  . sildenafil (REVATIO) 20 MG tablet Take 20-60 mg by mouth daily as needed (for ED).      No current facility-administered medications on file prior to visit.     No Known Allergies  Assessment/Plan:  1. Hyperlipidemia - After much discussion regarding the benefits of statin therapy, pt is agreeable to starting rosuvastatin 10mg  daily. Target LDL < 70 due to CAD. Will recheck lipids in 3 months same day as office visit with Dr Rayann Heman. Pt encouraged to increase activity and  decrease intake of saturated fats. F/u in lipid clinic as needed, low dose rosuvastatin should bring LDL to goal.   Osmin Welz E. Sarahann Horrell, PharmD, BCACP, [place] [address] [place], [place] [address] Phone: [phone]; Fax: [phone] 10/23/2018 9:17 AM

## 2018-11-01 ENCOUNTER — Other Ambulatory Visit (HOSPITAL_COMMUNITY): Payer: Self-pay | Admitting: Physician Assistant

## 2018-11-03 ENCOUNTER — Ambulatory Visit (HOSPITAL_COMMUNITY)
Admission: RE | Admit: 2018-11-03 | Discharge: 2018-11-03 | Disposition: A | Discharge status: Home / Self Care | Payer: 59 | Source: Ambulatory Visit | Attending: Nurse Practitioner | Admitting: Nurse Practitioner

## 2018-11-03 ENCOUNTER — Encounter (HOSPITAL_COMMUNITY): Payer: Self-pay | Admitting: Nurse Practitioner

## 2018-11-03 DIAGNOSIS — I491 Atrial premature depolarization: Secondary | ICD-10-CM | POA: Insufficient documentation

## 2018-11-03 DIAGNOSIS — I471 Supraventricular tachycardia: Secondary | ICD-10-CM | POA: Diagnosis not present

## 2018-11-03 DIAGNOSIS — I4891 Unspecified atrial fibrillation: Secondary | ICD-10-CM | POA: Diagnosis not present

## 2018-11-03 NOTE — Progress Notes (Signed)
Patient in for EKG visit 4 week post ablation. Adline Peals, PA to review

## 2018-11-03 NOTE — Progress Notes (Signed)
Pt seen for EKG today. 4 weeks post ablation.  EKG shows periods of AF with intermittent SR.  He feels that he has mostly been maintaining SR but this weekend was busy with shopping and more stressful.  He is aware he is having periods of AF today. He has not taken prn Diltiazem.  No bleeding complications, no groin issues.   He reports compliance with Xarelto. Plan to continue current plan. I have encouraged him to be aware of triggers and take prn Diltiazem as needed. Follow up with Dr Rayann Heman as scheduled in February.   Chanetta Marshall, NP 11/03/2018 9:07 AM

## 2018-11-05 NOTE — Telephone Encounter (Signed)
This is a one time post procedure prescription, no refills needed.

## 2019-01-12 ENCOUNTER — Ambulatory Visit (INDEPENDENT_AMBULATORY_CARE_PROVIDER_SITE_OTHER): Payer: 59 | Admitting: Internal Medicine

## 2019-01-12 ENCOUNTER — Encounter (INDEPENDENT_AMBULATORY_CARE_PROVIDER_SITE_OTHER): Payer: Self-pay

## 2019-01-12 ENCOUNTER — Encounter: Payer: Self-pay | Admitting: Internal Medicine

## 2019-01-12 ENCOUNTER — Other Ambulatory Visit: Payer: 59

## 2019-01-12 VITALS — BP 108/68 | HR 89 | Ht 68.0 in | Wt 164.8 lb

## 2019-01-12 DIAGNOSIS — I480 Paroxysmal atrial fibrillation: Secondary | ICD-10-CM | POA: Diagnosis not present

## 2019-01-12 DIAGNOSIS — I2510 Atherosclerotic heart disease of native coronary artery without angina pectoris: Secondary | ICD-10-CM | POA: Diagnosis not present

## 2019-01-12 DIAGNOSIS — I25709 Atherosclerosis of coronary artery bypass graft(s), unspecified, with unspecified angina pectoris: Secondary | ICD-10-CM

## 2019-01-12 DIAGNOSIS — E785 Hyperlipidemia, unspecified: Secondary | ICD-10-CM

## 2019-01-12 LAB — HEPATIC FUNCTION PANEL
ALT: 25 IU/L (ref 0–44)
AST: 19 IU/L (ref 0–40)
Albumin: 4.7 g/dL (ref 3.8–4.8)
Alkaline Phosphatase: 68 IU/L (ref 39–117)
Bilirubin Total: 0.7 mg/dL (ref 0.0–1.2)
Bilirubin, Direct: 0.23 mg/dL (ref 0.00–0.40)
Total Protein: 6.8 g/dL (ref 6.0–8.5)

## 2019-01-12 LAB — LIPID PANEL
Chol/HDL Ratio: 1.9 ratio (ref 0.0–5.0)
Cholesterol, Total: 118 mg/dL (ref 100–199)
HDL: 62 mg/dL (ref >39)
LDL Calculated: 47 mg/dL (ref 0–99)
Triglycerides: 45 mg/dL (ref 0–149)
VLDL Cholesterol Cal: 9 mg/dL (ref 5–40)

## 2019-01-12 LAB — CUP PACEART INCLINIC DEVICE CHECK
Date Time Interrogation Session: 20200224132559
Implantable Pulse Generator Implant Date: 20181218

## 2019-01-12 MED ORDER — ASPIRIN EC 81 MG PO TBEC: 81.0000 mg | DELAYED_RELEASE_TABLET | Freq: Every day | ORAL | 3 refills | Status: DC

## 2019-01-12 NOTE — Progress Notes (Addendum)
PCP: Leda Quail., MD  Ricardo [name] is a 63 y.o. male who presents today for routine electrophysiology followup.  Since his recent afib ablation, the patient reports doing very well.  he denies procedure related complications.  He continues to have ERAF.  Today, he denies symptoms of palpitations, chest pain, shortness of breath,  lower extremity edema, dizziness, presyncope, or syncope.  The patient is otherwise without complaint today.   Past Medical History:  Diagnosis Date  . Carpal tunnel syndrome 11-03-13   bilateral numbness-worse while sleep-remains an issue  . History of kidney stones    '12 x 1 episode  . Left inguinal hernia   . Paroxysmal atrial fibrillation (HCC)   . Prostate cancer [facility])    prostate   Past Surgical History:  Procedure Laterality Date  . ATRIAL FIBRILLATION ABLATION N/A 01/22/2017   Procedure: Atrial Fibrillation Ablation;  Surgeon: Thompson Grayer, MD;  Location: [place] [facility];  Service: Cardiovascular;  Laterality: N/A;  . ATRIAL FIBRILLATION ABLATION  10/09/2018  . ATRIAL FIBRILLATION ABLATION N/A 10/09/2018   Procedure: ATRIAL FIBRILLATION ABLATION;  Surgeon: Thompson Grayer, MD;  Location: [place] [facility];  Service: Cardiovascular;  Laterality: N/A;  . COLONOSCOPY  03/2006; 07/16/2011   2007: rectal adenoma 2012:Normal  . ELBOW SURGERY     bilateral ulnar nerve release  . HERNIA REPAIR Left 11/06/13   LIH  . INGUINAL HERNIA REPAIR Left 11/06/2013   Procedure: HERNIA REPAIR INGUINAL ADULT;  Surgeon: Odis Hollingshead, MD;  [facility];  Service: General;  Laterality: Left;  . INSERTION OF MESH Left 11/06/2013   Procedure: INSERTION OF MESH;  Surgeon: Odis Hollingshead, MD;  [facility];  Service: General;  Laterality: Left;  . KNEE ARTHROSCOPY     left  . LEFT HEART CATH AND CORONARY ANGIOGRAPHY N/A 08/11/2018   Procedure: LEFT HEART CATH AND CORONARY ANGIOGRAPHY;  Surgeon: Belva Crome, MD;  Location: [place] [facility]  [facility];  Service: Cardiovascular;  Laterality: N/A;  . LOOP RECORDER INSERTION Left 11/05/2017   Procedure: LOOP RECORDER INSERTION;  Surgeon: Thompson Grayer, MD;  Location: [place] [facility];  Service: Cardiovascular;  Laterality: Left;  . PROSTATE BIOPSY  05/04/2011   adenocarcinoma Gleason 6  . ROBOT ASSISTED LAPAROSCOPIC RADICAL PROSTATECTOMY  09/24/2011   Procedure: ROBOTIC ASSISTED LAPAROSCOPIC RADICAL PROSTATECTOMY LEVEL 1;  Surgeon: Dutch Gray, MD;  [facility];  Service: Urology;  Laterality: N/A;  . ULTRASOUND GUIDANCE FOR VASCULAR ACCESS  08/11/2018   Procedure: Ultrasound Guidance For Vascular Access;  Surgeon: Belva Crome, MD;  Location: [place] [facility];  Service: Cardiovascular;;    ROS- all systems are personally reviewed and negatives except as per HPI above  Current Outpatient Medications  Medication Sig Dispense Refill  . calcium carbonate (TUMS - DOSED IN MG ELEMENTAL CALCIUM) 500 MG chewable tablet Chew 1 tablet by mouth as needed for indigestion or heartburn.    . clotrimazole-betamethasone (LOTRISONE) cream Apply 1 application topically 2 (two) times daily as needed (for itching).   0  . diltiazem (CARDIZEM) 30 MG tablet Take 1-2 tablets (30-60 mg total) by mouth every 4 (four) hours as needed. 60 tablet 3  . Multiple Vitamin (MULTIVITAMIN WITH MINERALS) TABS tablet Take 1 tablet by mouth 2 (two) times daily. DoTerra Vitamin    . pantoprazole (PROTONIX) 40 MG tablet Take 1 tablet (40 mg total) by mouth daily. 45 tablet 0  . Polyvinyl Alcohol-Povidone (REFRESH OP) Place 1  drop into both eyes as needed (for dry eyes).    . Potassium 99 MG TABS Take 99 mg by mouth daily as needed (for leg cramps in the summer once weekly).     . raNITIdine HCl (CVS ACID REDUCER PO) Take 1-2 tablets by mouth daily as needed (for acid reducer.).    Marland Kitchen rivaroxaban (XARELTO) 20 MG TABS tablet Take 1 tablet (20 mg total) by mouth daily with supper. 30 tablet 11  . rosuvastatin (CRESTOR) 10  MG tablet Take 1 tablet (10 mg total) by mouth daily. 90 tablet 3  . sildenafil (REVATIO) 20 MG tablet Take 20-60 mg by mouth daily as needed (for ED).      No current facility-administered medications for this visit.     Physical Exam: Vitals:   01/12/19 0954  BP: 108/68  Pulse: 89  SpO2: 96%  Weight: 164 lb 12.8 oz (74.8 kg)  Height: 5\' 8"  (1.727 m)    GEN- The patient is well appearing, alert and oriented x 3 today.   Head- normocephalic, atraumatic Eyes-  Sclera clear, conjunctiva pink Ears- hearing intact Oropharynx- clear Lungs- Clear to ausculation bilaterally, normal work of breathing Heart- Regular rate and rhythm, no murmurs, rubs or gallops, PMI not laterally displaced GI- soft, NT, ND, + BS Extremities- no clubbing, cyanosis, or edema  EKG tracing ordered today is personally reviewed and shows sinus rhythm, normal ekg  Assessment and Plan:  1. Paroxysmal atrial fibrillation Doing well s/p ablation chads2vasc score is 0. Stop anticoagulation at this time afib burden is 13.8 % (9.8 % last visit-->8% visit prior) We discussed that for at least 3 months post ablation that we can see some ERAF.  Continue to monitor Consider flecainide if afib persists He has tried multaq previously without benefit.  2. CAD Non obstructive Lipid clinic notes reviewed He has started crestor lipids are pending today Restart ASA 81mg  daily  Return to see me in 3 months  Thompson Grayer MD, [facility] 01/12/2019 10:06 AM

## 2019-01-12 NOTE — Patient Instructions (Addendum)
Medication Instructions:  Your physician has recommended you make the following change in your medication:   1.  Stop XARELTO  2.  Start taking Aspirin 81 mg one tablet by mouth daily  Labwork: None ordered.  Testing/Procedures: None ordered.  Follow-Up: Your physician wants you to follow-up in: 3 months with Dr. Rayann Heman.      Any Other Special Instructions Will Be Listed Below (If Applicable).  If you need a refill on your cardiac medications before your next appointment, please call your pharmacy.

## 2019-01-14 ENCOUNTER — Telehealth: Payer: Self-pay

## 2019-01-14 NOTE — Telephone Encounter (Signed)
-----   Message from Leeroy Bock, [facility] sent at 01/13/2019  7:14 AM EST ----- LDL much improved and now at goal < 70 since starting rosuvastatin 10mg  daily. Recommend continuing current therapy.

## 2019-01-14 NOTE — Telephone Encounter (Signed)
LMTCB

## 2019-01-15 NOTE — Telephone Encounter (Signed)
New Message   PT is calling back for results.  Please call back

## 2019-01-16 NOTE — Telephone Encounter (Signed)
Left detailed message per DPR.  Advised LDL is at goal now on medication.  Continue.

## 2019-01-16 NOTE — Telephone Encounter (Signed)
Follow Up:      Pt calling back for his results.

## 2019-01-16 NOTE — Telephone Encounter (Signed)
Left the pt a message to call the office back and request to speak with a triage nurse or Dr Jackalyn Lombard RN, to endorse lipid results per Dr Rayann Heman and [place] Pharmacist.

## 2019-02-02 ENCOUNTER — Other Ambulatory Visit: Payer: Self-pay

## 2019-02-02 MED ORDER — DILTIAZEM HCL 30 MG PO TABS: 30.0000 mg | ORAL_TABLET | ORAL | 3 refills | Status: DC | PRN

## 2019-04-16 ENCOUNTER — Telehealth: Payer: Self-pay

## 2019-04-16 NOTE — Telephone Encounter (Signed)
Patient returned your call, please call him on his cell phone.

## 2019-04-16 NOTE — Telephone Encounter (Signed)
Left message regarding appt on 04/17/19.

## 2019-04-17 ENCOUNTER — Telehealth: Payer: 59 | Admitting: Internal Medicine

## 2019-04-17 ENCOUNTER — Other Ambulatory Visit: Payer: Self-pay

## 2019-04-17 NOTE — Telephone Encounter (Signed)
Follow up: ° ° °Patient returning call  ° ° ° °

## 2019-05-13 ENCOUNTER — Encounter: Payer: Self-pay | Admitting: Internal Medicine

## 2019-07-28 ENCOUNTER — Telehealth: Payer: Self-pay

## 2019-07-28 NOTE — Telephone Encounter (Signed)
Spoke with pt regarding appt on 07/29/19. Pt stated he only wants office visits. Pt was informed that a schedulers will reach out to get him scheduled for an in office appt with Dr Rayann Heman.

## 2019-07-29 ENCOUNTER — Telehealth: Payer: 59 | Admitting: Internal Medicine

## 2019-07-29 ENCOUNTER — Other Ambulatory Visit: Payer: Self-pay

## 2019-07-31 ENCOUNTER — Other Ambulatory Visit: Payer: Self-pay | Admitting: Internal Medicine

## 2019-08-05 ENCOUNTER — Encounter: Payer: 59 | Admitting: Nurse Practitioner

## 2019-08-11 NOTE — Progress Notes (Addendum)
Electrophysiology Office Note Date: 08/11/2019  ID:  Mcclellan, Pokorski [birth year], MRN [id number]  PCP: Leda Quail., MD Primary Cardiologist: No primary care provider on file. Electrophysiologist: None  CC: Pacemaker follow-up  Ricardo [name] is a 63 y.o. male seen today for Dr. Rayann Heman. He presents today for routine electrophysiology followup.  Since last being seen in our clinic, the patient reports doing well overall. He feels like he is having more atrial fibrillation. He feels it is occasionally related to having a large meal. He denies heavy ETOH or caffeine use. He does feel more fatigued with decreased exercise tolerance when he is in Afib. He denies chest pain, PND, orthopnea, nausea, vomiting, dizziness, syncope, edema, weight gain, or early satiety.  Device History: Medtronic ILR PPM implanted 10/2017 for Atrial fib  Past Medical History:  Diagnosis Date  . Carpal tunnel syndrome 11-03-13   bilateral numbness-worse while sleep-remains an issue  . History of kidney stones    '12 x 1 episode  . Left inguinal hernia   . Paroxysmal atrial fibrillation (HCC)   . Prostate cancer [facility])    prostate   Past Surgical History:  Procedure Laterality Date  . ATRIAL FIBRILLATION ABLATION N/A 01/22/2017   Procedure: Atrial Fibrillation Ablation;  Surgeon: Thompson Grayer, MD;  Location: [place] CV LAB;  Service: Cardiovascular;  Laterality: N/A;  . ATRIAL FIBRILLATION ABLATION  10/09/2018  . ATRIAL FIBRILLATION ABLATION N/A 10/09/2018   Procedure: ATRIAL FIBRILLATION ABLATION;  Surgeon: Thompson Grayer, MD;  Location: [place] [facility];  Service: Cardiovascular;  Laterality: N/A;  . COLONOSCOPY  03/2006; 07/16/2011   2007: rectal adenoma 2012:Normal  . ELBOW SURGERY     bilateral ulnar nerve release  . HERNIA REPAIR Left 11/06/13   LIH  . INGUINAL HERNIA REPAIR Left 11/06/2013   Procedure: HERNIA REPAIR INGUINAL ADULT;  Surgeon: Odis Hollingshead, MD;  [facility];  Service: General;  Laterality: Left;  . INSERTION OF MESH Left 11/06/2013   Procedure: INSERTION OF MESH;  Surgeon: Odis Hollingshead, MD;  [facility];  Service: General;  Laterality: Left;  . KNEE ARTHROSCOPY     left  . LEFT HEART CATH AND CORONARY ANGIOGRAPHY N/A 08/11/2018   Procedure: LEFT HEART CATH AND CORONARY ANGIOGRAPHY;  Surgeon: Belva Crome, MD;  Location: [place] CV LAB;  Service: Cardiovascular;  Laterality: N/A;  . LOOP RECORDER INSERTION Left 11/05/2017   Procedure: LOOP RECORDER INSERTION;  Surgeon: Thompson Grayer, MD;  Location: [place] CV LAB;  Service: Cardiovascular;  Laterality: Left;  . PROSTATE BIOPSY  05/04/2011   adenocarcinoma Gleason 6  . ROBOT ASSISTED LAPAROSCOPIC RADICAL PROSTATECTOMY  09/24/2011   Procedure: ROBOTIC ASSISTED LAPAROSCOPIC RADICAL PROSTATECTOMY LEVEL 1;  Surgeon: Dutch Gray, MD;  [facility];  Service: Urology;  Laterality: N/A;  . ULTRASOUND GUIDANCE FOR VASCULAR ACCESS  08/11/2018   Procedure: Ultrasound Guidance For Vascular Access;  Surgeon: Belva Crome, MD;  Location: [place] [facility] LAB;  Service: Cardiovascular;;    Current Outpatient Medications  Medication Sig Dispense Refill  . aspirin EC 81 MG tablet Take 1 tablet (81 mg total) by mouth daily. 90 tablet 3  . calcium carbonate (TUMS - DOSED IN MG ELEMENTAL CALCIUM) 500 MG chewable tablet Chew 1 tablet by mouth as needed for indigestion or heartburn.    . clotrimazole-betamethasone (LOTRISONE) cream Apply 1 application topically 2 (two) times daily as needed (for itching).   0  . diltiazem (  CARDIZEM) 30 MG tablet TAKE 1-2 TABLETS (30-60 MG TOTAL) BY MOUTH EVERY 4 (FOUR) HOURS AS NEEDED. 60 tablet 5  . Multiple Vitamin (MULTIVITAMIN WITH MINERALS) TABS tablet Take 1 tablet by mouth 2 (two) times daily. DoTerra Vitamin    . pantoprazole (PROTONIX) 40 MG tablet Take 1 tablet (40 mg total) by mouth daily. 45 tablet 0  . Polyvinyl Alcohol-Povidone (REFRESH OP) Place  1 drop into both eyes as needed (for dry eyes).    . Potassium 99 MG TABS Take 99 mg by mouth daily as needed (for leg cramps in the summer once weekly).     . raNITIdine HCl (CVS ACID REDUCER PO) Take 1-2 tablets by mouth daily as needed (for acid reducer.).    Marland Kitchen rosuvastatin (CRESTOR) 10 MG tablet Take 1 tablet (10 mg total) by mouth daily. 90 tablet 3  . sildenafil (REVATIO) 20 MG tablet Take 20-60 mg by mouth daily as needed (for ED).      No current facility-administered medications for this visit.     Allergies:   Patient has no known allergies.   Social History: Social History   Socioeconomic History  . Marital status: Divorced    Spouse name: Not on file  . Number of children: Not on file  . Years of education: Not on file  . Highest education level: Not on file  Occupational History  . Not on file  Social Needs  . Financial resource strain: Not on file  . Food insecurity    Worry: Not on file    Inability: Not on file  . Transportation needs    Medical: Not on file    Non-medical: Not on file  Tobacco Use  . Smoking status: Former Smoker    Quit date: 09/17/2001    Years since quitting: 17.9  . Smokeless tobacco: Never Used  Substance and Sexual Activity  . Alcohol use: Yes    Alcohol/week: 12.0 standard drinks    Types: 5 Glasses of wine, 7 Cans of beer per week    Comment: daily  . Drug use: No  . Sexual activity: Yes  Lifestyle  . Physical activity    Days per week: Not on file    Minutes per session: Not on file  . Stress: Not on file  Relationships  . Social Herbalist on phone: Not on file    Gets together: Not on file    Attends religious service: Not on file    Active member of club or organization: Not on file    Attends meetings of clubs or organizations: Not on file    Relationship status: Not on file  . Intimate partner violence    Fear of current or ex partner: Not on file    Emotionally abused: Not on file    Physically  abused: Not on file    Forced sexual activity: Not on file  Other Topics Concern  . 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 Prostate cancer Father      Review of Systems: All other systems reviewed and are otherwise negative except as noted above.  Physical Exam: Vitals:   08/12/19 1247  BP: 130/78  Pulse: 80  Weight: 161 lb (73 kg)     GEN- The patient is well appearing, alert and oriented x  3 today.   HEENT: normocephalic, atraumatic; sclera clear, conjunctiva pink; hearing intact; oropharynx clear; neck supple  Lungs- Clear to ausculation bilaterally, normal work of breathing.  No wheezes, rales, rhonchi Heart- Regular rate and rhythm, no murmurs, rubs or gallops  GI- soft, non-tender, non-distended, bowel sounds present  Extremities- no clubbing, cyanosis, or edema  MS- no significant deformity or atrophy Skin- warm and dry, no rash or lesion; PPM pocket well healed Psych- euthymic mood, full affect Neuro- strength and sensation are intact  PPM Interrogation- reviewed in detail today,  See PACEART report  EKG:  EKG is ordered today. The ekg ordered today shows NSR with PR interval 140 ms, QRS 88 ms  Recent Labs: 09/16/2018: BUN 14; Creatinine, Ser 1.13; Hemoglobin 15.2; Platelets 187; Potassium 4.7; Sodium 142 01/12/2019: ALT 25   Wt Readings from Last 3 Encounters:  01/12/19 164 lb 12.8 oz (74.8 kg)  10/15/18 156 lb (70.8 kg)  10/10/18 155 lb 3.3 oz (70.4 kg)     Other studies Reviewed: Additional studies/ records that were reviewed today include: Echo 12/2016 LVEF 55-60%,  EPS 09/2018 op report, most recent labs.   Assessment and Plan:  1.  Paroxysmal atrial fibrillation  Doing well s/p ablation.  CHA2DS2VASC is 1 (non-obstructive CAD), thus is off [place]. He overall has a low burden, but may need to consider resuming once he is 64,  as will increase his score. AF burden 14%, which is increased by ILR check.  Continue to monitor.  He would like to try flecainide. Will plan to start flecainide if thought appropriate once discussed with Dr. Rayann Heman, as below.  Failed multaq due to lack of benefit.   2. CAD Non-obstructive Continue crestor. Will send lipids today as requested.  Continue ASA 81 mg daily.   Current medicines are reviewed at length with the patient today.   The patient does not have concerns regarding his medicines.  The following changes were made today:  none  Labs/ tests ordered today include:  Orders Placed This Encounter  Procedures  . Basic metabolic panel  . Lipid panel  . EKG 12-Lead    Disposition:   Follow up with EP APP in 4 weeks with flecainide start. (will plan EKG depending on when/if he starts)  ADDENDUM: Discussed with patient. He will not start flecainide until case is reviewed with Dr. Rayann Heman. Will need beta blocker on board as well, and may need alternate dosing than originally discussed.   Jacalyn Lefevre, PA-C  08/11/2019 12:59 PM  [place] [address] [phone] (office) [phone] (fax)

## 2019-08-12 ENCOUNTER — Ambulatory Visit (INDEPENDENT_AMBULATORY_CARE_PROVIDER_SITE_OTHER): Payer: 59 | Admitting: Student

## 2019-08-12 ENCOUNTER — Other Ambulatory Visit: Payer: Self-pay

## 2019-08-12 VITALS — BP 130/78 | HR 80 | Wt 161.0 lb

## 2019-08-12 DIAGNOSIS — E785 Hyperlipidemia, unspecified: Secondary | ICD-10-CM

## 2019-08-12 DIAGNOSIS — R002 Palpitations: Secondary | ICD-10-CM | POA: Diagnosis not present

## 2019-08-12 DIAGNOSIS — I2510 Atherosclerotic heart disease of native coronary artery without angina pectoris: Secondary | ICD-10-CM

## 2019-08-12 DIAGNOSIS — R079 Chest pain, unspecified: Secondary | ICD-10-CM

## 2019-08-12 DIAGNOSIS — I480 Paroxysmal atrial fibrillation: Secondary | ICD-10-CM

## 2019-08-12 LAB — CUP PACEART INCLINIC DEVICE CHECK
Date Time Interrogation Session: 20200923140027
Implantable Pulse Generator Implant Date: 20181218

## 2019-08-12 MED ORDER — FLECAINIDE ACETATE 50 MG PO TABS: 50.0000 mg | ORAL_TABLET | Freq: Two times a day (BID) | ORAL | 3 refills | Status: DC

## 2019-08-12 NOTE — Patient Instructions (Signed)
Medication Instructions:    START TAKING  FLECAINIDE 50 MG TWICE A DAY   If you need a refill on your cardiac medications before your next appointment, please call your pharmacy.   Lab work:  BMET AND LIPID   If you have labs (blood work) drawn today and your tests are completely normal, you will receive your results only by: Marland Kitchen MyChart Message (if you have MyChart) OR . A paper copy in the mail If you have any lab test that is abnormal or we need to change your treatment, we will call you to review the results.  Testing/Procedures: NONE ORDERED  TODAY  Follow-Up: At [facility], you and your health needs are our priority.  As part of our continuing mission to provide you with exceptional heart care, we have created designated Provider Care Teams.  These Care Teams include your primary Cardiologist (physician) and Advanced Practice Providers (APPs -  Physician Assistants and Nurse Practitioners) who all work together to provide you with the care you need, when you need it. You will need a follow up appointment in 4 weeks. You may see [place] PA-C  or  Charlcie Cradle  PA-C  Any Other Special Instructions Will Be Listed Below (If Applicable).

## 2019-08-13 LAB — BASIC METABOLIC PANEL
BUN/Creatinine Ratio: 13 (ref 10–24)
BUN: 14 mg/dL (ref 8–27)
CO2: 25 mmol/L (ref 20–29)
Calcium: 9.9 mg/dL (ref 8.6–10.2)
Chloride: 105 mmol/L (ref 96–106)
Creatinine, Ser: 1.05 mg/dL (ref 0.76–1.27)
GFR calc Af Amer: 87 mL/min/{1.73_m2} (ref >59)
GFR calc non Af Amer: 75 mL/min/{1.73_m2} (ref >59)
Glucose: 84 mg/dL (ref 65–99)
Potassium: 4.4 mmol/L (ref 3.5–5.2)
Sodium: 142 mmol/L (ref 134–144)

## 2019-08-13 LAB — LIPID PANEL
Chol/HDL Ratio: 1.5 ratio (ref 0.0–5.0)
Cholesterol, Total: 128 mg/dL (ref 100–199)
HDL: 85 mg/dL (ref >39)
LDL Chol Calc (NIH): 27 mg/dL (ref 0–99)
Triglycerides: 81 mg/dL (ref 0–149)
VLDL Cholesterol Cal: 16 mg/dL (ref 5–40)

## 2019-08-21 ENCOUNTER — Telehealth: Payer: Self-pay

## 2019-08-21 DIAGNOSIS — I480 Paroxysmal atrial fibrillation: Secondary | ICD-10-CM

## 2019-08-21 MED ORDER — DILTIAZEM HCL ER COATED BEADS 120 MG PO CP24: 120.0000 mg | ORAL_CAPSULE | Freq: Every day | ORAL | 3 refills | Status: DC

## 2019-08-21 MED ORDER — FLECAINIDE ACETATE 50 MG PO TABS: 50.0000 mg | ORAL_TABLET | Freq: Two times a day (BID) | ORAL | 3 refills | Status: DC

## 2019-08-21 NOTE — Telephone Encounter (Signed)
I spoke to the patient with Andy's recommendation per Dr Rayann Heman.  The patient will start Diltiazem 120 mg Daily (an increase from 30 mg prn) and will start Flecainide 50 mg bid.  He will come into the office for nurse visit the week of 10/12 for EKG.Marland Kitchen

## 2019-08-21 NOTE — Telephone Encounter (Signed)
-----   Message from Shirley Friar, PA-C sent at 08/21/2019  2:07 PM EDT ----- I discussed this patients case with Dr. Rayann Heman and needed to start some medication for him, please  Can we please start: Flecainide 50 mg BID and Diltiazem 120 mg daily.   (Flecainide previously ordered, but Instructed him to not start until I discussed case and plan with Dr. Rayann Heman, so may need to be re-ordered).  He will need an RN visit in 1 week with an EKG (OK for early the week of  10/12).   Thank you!  Legrand Como [address] [place], PA-C  08/21/2019 2:09 PM

## 2019-08-26 ENCOUNTER — Ambulatory Visit: Payer: 59

## 2019-09-02 ENCOUNTER — Other Ambulatory Visit: Payer: Self-pay

## 2019-09-02 ENCOUNTER — Ambulatory Visit (INDEPENDENT_AMBULATORY_CARE_PROVIDER_SITE_OTHER): Payer: 59

## 2019-09-02 VITALS — BP 122/78 | HR 73 | Ht 68.0 in | Wt 161.0 lb

## 2019-09-02 DIAGNOSIS — I480 Paroxysmal atrial fibrillation: Secondary | ICD-10-CM

## 2019-09-02 NOTE — Progress Notes (Signed)
1.) Reason for visit: EKG  2.) Name of MD requesting visit: Dr Alycia Rossetti, PA  3.) H&P: See Chart  4.) ROS related to problem: Patient started on Flecainide 50 mg bid and Diltiazem 120 mg Daily 10/2  5.) Assessment and plan per MD: Continue as prescribed per (DOD) Dr Tamala Julian  Patient would like a DOT letter from Dr Rayann Heman allowing him to drive by [id number].  He has an appointment with Jonni Sanger on November 10, maybe letter could be available then.  He would also like to start COQ-10 and Turmeric from his PCP.  Would this be allowed?  Thank you

## 2019-09-02 NOTE — Patient Instructions (Signed)
Medication Instructions:  Continue with current medications If you need a refill on your cardiac medications before your next appointment, please call your pharmacy.   Lab work: None ordered  If you have labs (blood work) drawn today and your tests are completely normal, you will receive your results only by: Marland Kitchen MyChart Message (if you have MyChart) OR . A paper copy in the mail If you have any lab test that is abnormal or we need to change your treatment, we will call you to review the results.  Testing/Procedures: None ordered  Follow-Up: Pt would like to establish with General Cardiology, prefers Dr. Tamala Julian who did cath last year. [place] with Dr. Tamala Julian to schedule New Pt appt.  Any Other Special Instructions Will Be Listed Below (If Applicable).

## 2019-09-09 ENCOUNTER — Telehealth: Payer: Self-pay

## 2019-09-09 NOTE — Telephone Encounter (Signed)
I called and left patient a message about moving 09/29/19 with Oda Kilts to tomorrow or Friday

## 2019-09-10 ENCOUNTER — Telehealth: Payer: Self-pay | Admitting: Internal Medicine

## 2019-09-10 NOTE — Telephone Encounter (Signed)
Depending on what type of letter he needs, that is. I am not DOT certified/registered, if that is required.

## 2019-09-10 NOTE — Telephone Encounter (Signed)
Jonni Sanger, I am not sure. Richardson Dopp, [facility] would be a great resource about the DOT letters. That would be my recommendation.

## 2019-09-10 NOTE — Telephone Encounter (Signed)
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

## 2019-09-10 NOTE — Telephone Encounter (Signed)
It was my understanding that this had to come from a provider who is certified to do such things, of which I am not.   Do you know if that's the case?   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

## 2019-09-10 NOTE — Telephone Encounter (Signed)
Thank you, [name]!  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  He has a normal EF, and a loop recorder, NOT an ICD. As we are following his Afib.     Is this something I can provide for him? Or does this need to be a certain type of letter?  Thank you!  Legrand Como [address]" [place], [place]  09/10/2019 3:39 PM

## 2019-09-10 NOTE — Telephone Encounter (Signed)
I have provided those letters in the past. Unless someone is restricted from driving b/c of syncope or VT/ICD, they can drive.  Really if they are able to drive a car we can clear them to drive a truck. This statement is different from the DOT physical that gets completed.  That is done by someone certified in that.  We sometimes get asked to do a stress test or echocardiogram and provide our assessment of their CV health. I recommend being brief and specific to their cardiac diagnosis.  So, for example, for this patient, I would say something like "his AFib is controlled and there are no cardiac issues that limit his driving." If he has other issues besides atrial fibrillation, it would probably be better to have his primary cardiologist write the letter. Hope this helps, [place]

## 2019-09-11 NOTE — Telephone Encounter (Signed)
Thank you :)

## 2019-09-11 NOTE — Telephone Encounter (Signed)
Left HIPPA compliant message on pt cell phone. We will take care of letter at visit 11/11, he will call if needs sooner.

## 2019-09-11 NOTE — Progress Notes (Signed)
Thanks, Legrand Como already messaged me separately.

## 2019-09-29 ENCOUNTER — Encounter: Payer: 59 | Admitting: Student

## 2019-09-29 NOTE — Progress Notes (Signed)
PCP:  Leda Quail., MD Primary Cardiologist: No primary care provider on file. Electrophysiologist: Dr. Lyn Hollingshead Ricardo [name] is a 63 y.o. male with past medical history of persistent atrial fibrillation with previous ablation 01/2017 and non-obstructive CAD who presents today for routine electrophysiology followup. They are seen for Dr. Rayann Heman.   Since last being seen in our clinic, the patient reports doing well overall. He feels about the same, maybe "slightly better".  He still feels occasional palpitations. He had frequent palpitations last Friday. No specific aggravating or relieving factors that day. He denies lightheadedness, syncope, near syncope, dyspnea, nausea, vomiting, or dizziness. His primary complaint on days he has palpitations is fatigue.   DEVICE HISTORY Medtronic ILR implanted 12/218 for atrial fib.   Past Medical History:  Diagnosis Date  . Carpal tunnel syndrome 11-03-13   bilateral numbness-worse while sleep-remains an issue  . History of kidney stones    '12 x 1 episode  . Left inguinal hernia   . Paroxysmal atrial fibrillation (HCC)   . Prostate cancer [facility])    prostate   Past Surgical History:  Procedure Laterality Date  . ATRIAL FIBRILLATION ABLATION N/A 01/22/2017   Procedure: Atrial Fibrillation Ablation;  Surgeon: Thompson Grayer, MD;  Location: [place] [facility] LAB;  Service: Cardiovascular;  Laterality: N/A;  . ATRIAL FIBRILLATION ABLATION  10/09/2018  . ATRIAL FIBRILLATION ABLATION N/A 10/09/2018   Procedure: ATRIAL FIBRILLATION ABLATION;  Surgeon: Thompson Grayer, MD;  Location: [place] [facility] LAB;  Service: Cardiovascular;  Laterality: N/A;  . COLONOSCOPY  03/2006; 07/16/2011   2007: rectal adenoma 2012:Normal  . ELBOW SURGERY     bilateral ulnar nerve release  . HERNIA REPAIR Left 11/06/13   LIH  . INGUINAL HERNIA REPAIR Left 11/06/2013   Procedure: HERNIA REPAIR INGUINAL ADULT;  Surgeon: Odis Hollingshead, MD;  [facility];  Service:  General;  Laterality: Left;  . INSERTION OF MESH Left 11/06/2013   Procedure: INSERTION OF MESH;  Surgeon: Odis Hollingshead, MD;  [facility];  Service: General;  Laterality: Left;  . KNEE ARTHROSCOPY     left  . LEFT HEART CATH AND CORONARY ANGIOGRAPHY N/A 08/11/2018   Procedure: LEFT HEART CATH AND CORONARY ANGIOGRAPHY;  Surgeon: Belva Crome, MD;  Location: [place] [facility] LAB;  Service: Cardiovascular;  Laterality: N/A;  . LOOP RECORDER INSERTION Left 11/05/2017   Procedure: LOOP RECORDER INSERTION;  Surgeon: Thompson Grayer, MD;  Location: [place] [facility] LAB;  Service: Cardiovascular;  Laterality: Left;  . PROSTATE BIOPSY  05/04/2011   adenocarcinoma Gleason 6  . ROBOT ASSISTED LAPAROSCOPIC RADICAL PROSTATECTOMY  09/24/2011   Procedure: ROBOTIC ASSISTED LAPAROSCOPIC RADICAL PROSTATECTOMY LEVEL 1;  Surgeon: Dutch Gray, MD;  [facility];  Service: Urology;  Laterality: N/A;  . ULTRASOUND GUIDANCE FOR VASCULAR ACCESS  08/11/2018   Procedure: Ultrasound Guidance For Vascular Access;  Surgeon: Belva Crome, MD;  Location: [place] [facility];  Service: Cardiovascular;;    Current Outpatient Medications  Medication Sig Dispense Refill  . aspirin EC 81 MG tablet Take 1 tablet (81 mg total) by mouth daily. 90 tablet 3  . clotrimazole-betamethasone (LOTRISONE) cream Apply 1 application topically 2 (two) times daily as needed (for itching).   0  . diltiazem (CARDIZEM CD) 120 MG 24 hr capsule Take 1 capsule (120 mg total) by mouth daily. 90 capsule 3  . diltiazem (CARDIZEM) 30 MG tablet Take 1 tablet (30 mg total) by mouth 2 (two) times  daily as needed (for breakthrough afib.). 180 tablet 3  . flecainide (TAMBOCOR) 50 MG tablet Take 1 tablet (50 mg total) by mouth 2 (two) times daily. 180 tablet 3  . Multiple Vitamin (MULTIVITAMIN WITH MINERALS) TABS tablet Take 1 tablet by mouth 2 (two) times daily. DoTerra Vitamin    . Polyvinyl Alcohol-Povidone (REFRESH OP) Place 1 drop into both eyes as  needed (for dry eyes).    . Potassium 99 MG TABS Take 99 mg by mouth daily as needed (for leg cramps in the summer once weekly).     . raNITIdine HCl (CVS ACID REDUCER PO) Take 1-2 tablets by mouth daily as needed (for acid reducer.).    Marland Kitchen rosuvastatin (CRESTOR) 10 MG tablet Take 1 tablet (10 mg total) by mouth daily. 90 tablet 3  . sildenafil (REVATIO) 20 MG tablet Take 20-60 mg by mouth daily as needed (for ED).      No current facility-administered medications for this visit.     No Known Allergies  Social History   Socioeconomic History  . Marital status: Divorced    Spouse name: Not on file  . Number of children: Not on file  . Years of education: Not on file  . Highest education level: Not on file  Occupational History  . Not on file  Social Needs  . Financial resource strain: Not on file  . Food insecurity    Worry: Not on file    Inability: Not on file  . Transportation needs    Medical: Not on file    Non-medical: Not on file  Tobacco Use  . Smoking status: Former Smoker    Quit date: 09/17/2001    Years since quitting: 18.0  . Smokeless tobacco: Never Used  Substance and Sexual Activity  . Alcohol use: Yes    Alcohol/week: 12.0 standard drinks    Types: 5 Glasses of wine, 7 Cans of beer per week    Comment: daily  . Drug use: No  . Sexual activity: Yes  Lifestyle  . Physical activity    Days per week: Not on file    Minutes per session: Not on file  . Stress: Not on file  Relationships  . Social Herbalist on phone: Not on file    Gets together: Not on file    Attends religious service: Not on file    Active member of club or organization: Not on file    Attends meetings of clubs or organizations: Not on file    Relationship status: Not on file  . Intimate partner violence    Fear of current or ex partner: Not on file    Emotionally abused: Not on file    Physically abused: Not on file    Forced sexual activity: Not on file  Other Topics  Concern  . 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  Physical Exam: Vitals:   09/30/19 1032  BP:  124/84  Pulse: 75  Weight: 163 lb (73.9 kg)  Height: 5\' 8"  (1.727 m)    GEN- The patient is well appearing, alert and oriented x 3 today.   HEENT: normocephalic, atraumatic; sclera clear, conjunctiva pink; hearing intact; oropharynx clear; neck supple, no JVP Lymph- no cervical lymphadenopathy Lungs- Clear to ausculation bilaterally, normal work of breathing.  No wheezes, rales, rhonchi Heart- Regular rate and rhythm, no murmurs, rubs or gallops, PMI not laterally displaced GI- soft, non-tender, non-distended, bowel sounds present, no hepatosplenomegaly Extremities- no clubbing, cyanosis, or edema; DP/PT/radial pulses 2+ bilaterally MS- no significant deformity or atrophy Skin- warm and dry, no rash or lesion Psych- euthymic mood, full affect Neuro- strength and sensation are intact  EKG is ordered. Personal review shows NSR at 75 bpm, PR interval 158 ms.   Assessment and Plan:  1. Paroxysmal atrial fibrillation Has h/o ablation CHA2DS2VASC of 1  (Non obstructive CAD) -> Not diabetic Started on flecainide 50 mg BID [place] improved by nearly 10%, from 14% -> ~4% today. No change to meds today.  There was no associated episode with his complaints of fatigue and palpitations this past Friday,  and is at nominal settings for sensitivity and ectopy rejection. Will also give short acting diltiazem to take as needed for symptoms of palpitations. Encouraged him to let us know and send manual transmission if he has prolonged episodes.   2. Non obstructive CAD 40-50% eccentric proximal LAD noted on cath 07/2018 Follow closely on flecainide  Disposition: Continue flecainide and diltiazem. Prn diltiazem for breakthrough. RTC 6 months, sooner with symptoms. Continue monthly summary of loop.   Shirley Friar, PA-C  09/30/19 12:46 PM

## 2019-09-30 ENCOUNTER — Other Ambulatory Visit: Payer: Self-pay

## 2019-09-30 ENCOUNTER — Encounter: Payer: Self-pay | Admitting: Student

## 2019-09-30 ENCOUNTER — Ambulatory Visit (INDEPENDENT_AMBULATORY_CARE_PROVIDER_SITE_OTHER): Payer: 59 | Admitting: Student

## 2019-09-30 VITALS — BP 124/84 | HR 75 | Ht 68.0 in | Wt 163.0 lb

## 2019-09-30 DIAGNOSIS — I480 Paroxysmal atrial fibrillation: Secondary | ICD-10-CM

## 2019-09-30 DIAGNOSIS — I2510 Atherosclerotic heart disease of native coronary artery without angina pectoris: Secondary | ICD-10-CM | POA: Diagnosis not present

## 2019-09-30 LAB — CUP PACEART INCLINIC DEVICE CHECK
Date Time Interrogation Session: 20201111140358
Implantable Pulse Generator Implant Date: 20181218

## 2019-09-30 MED ORDER — DILTIAZEM HCL 30 MG PO TABS: 30.0000 mg | ORAL_TABLET | Freq: Two times a day (BID) | ORAL | 3 refills | Status: AC | PRN

## 2019-09-30 NOTE — Patient Instructions (Addendum)
Medication Instructions:   STAR TAKING DILTIAZEM 30 MG AS NEEDED FOR  AFIB   *If you need a refill on your cardiac medications before your next appointment, please call your pharmacy*  Lab Work: NONE ORDERED  TODAY   If you have labs (blood work) drawn today and your tests are completely normal, you will receive your results only by: Marland Kitchen MyChart Message (if you have MyChart) OR . A paper copy in the mail If you have any lab test that is abnormal or we need to change your treatment, we will call you to review the results.  Testing/Procedures: NONE ORDERED  TODAY  Follow-Up: At [facility], you and your health needs are our priority.  As part of our continuing mission to provide you with exceptional heart care, we have created designated Provider Care Teams.  These Care Teams include your primary Cardiologist (physician) and Advanced Practice Providers (APPs -  Physician Assistants and Nurse Practitioners) who all work together to provide you with the care you need, when you need it.  Your next appointment:   6 months  The format for your next appointment:   In Person  Provider:   You may see Dr. Rayann Heman  or one of the following Advanced Practice Providers on your designated Care Team:    Chanetta Marshall, NP  Tommye Standard, PA-C  Legrand Como "Oda [place], [place]   Other Instructions

## 2019-11-07 ENCOUNTER — Other Ambulatory Visit: Payer: Self-pay | Admitting: Internal Medicine

## 2019-12-01 NOTE — Progress Notes (Signed)
Cardiology Office Note:    Date:  12/02/2019   ID:  Ricardo [name], DOB [birth year], MRN [id number]  PCP:  Leda Quail., MD  Cardiologist:  No primary care provider on file.   Referring MD: Leda Quail*   Chief Complaint  Patient presents with  . Coronary Artery Disease    History of Present Illness:    Ricardo [name] is a 64 y.o. male with a hx of non-obstructive CAD, PAF with recent ablation, hyperlipidemia, anticoagulation therapy.  He has been concerned about the diagnosis of nonobstructive coronary disease.  Angiography in September 2019 demonstrated an eccentric 40% proximal LAD stenosis.  Aspirin and statin therapy were recommended.  He has remained asymptomatic.  He wanted a more general preventive approach and was therefore referred for discussion and game plan.  He works at [employer].  He is active most days.  He has no limitations or symptoms to suggest ischemia.  Both his mother and father have lived deep into their 19s and are still alive.  He has no siblings with vascular disease.  He has been trouble with paroxysmal atrial fibrillation for years.  Past Medical History:  Diagnosis Date  . Carpal tunnel syndrome 11-03-13   bilateral numbness-worse while sleep-remains an issue  . History of kidney stones    '12 x 1 episode  . Left inguinal hernia   . Paroxysmal atrial fibrillation (HCC)   . Prostate cancer [facility])    prostate    Past Surgical History:  Procedure Laterality Date  . ATRIAL FIBRILLATION ABLATION N/A 01/22/2017   Procedure: Atrial Fibrillation Ablation;  Surgeon: Thompson Grayer, MD;  Location: [place] [facility];  Service: Cardiovascular;  Laterality: N/A;  . ATRIAL FIBRILLATION ABLATION  10/09/2018  . ATRIAL FIBRILLATION ABLATION N/A 10/09/2018   Procedure: ATRIAL FIBRILLATION ABLATION;  Surgeon: Thompson Grayer, MD;  Location: [place] [facility];  Service: Cardiovascular;  Laterality: N/A;  . COLONOSCOPY  03/2006; 07/16/2011   2007:  rectal adenoma 2012:Normal  . ELBOW SURGERY     bilateral ulnar nerve release  . HERNIA REPAIR Left 11/06/13   LIH  . INGUINAL HERNIA REPAIR Left 11/06/2013   Procedure: HERNIA REPAIR INGUINAL ADULT;  Surgeon: Odis Hollingshead, MD;  Location: [facility];  Service: General;  Laterality: Left;  . INSERTION OF MESH Left 11/06/2013   Procedure: INSERTION OF MESH;  Surgeon: Odis Hollingshead, MD;  Location: [facility];  Service: General;  Laterality: Left;  . KNEE ARTHROSCOPY     left  . LEFT HEART CATH AND CORONARY ANGIOGRAPHY N/A 08/11/2018   Procedure: LEFT HEART CATH AND CORONARY ANGIOGRAPHY;  Surgeon: Belva Crome, MD;  Location: [place] [facility];  Service: Cardiovascular;  Laterality: N/A;  . LOOP RECORDER INSERTION Left 11/05/2017   Procedure: LOOP RECORDER INSERTION;  Surgeon: Thompson Grayer, MD;  Location: [place] [facility];  Service: Cardiovascular;  Laterality: Left;  . PROSTATE BIOPSY  05/04/2011   adenocarcinoma Gleason 6  . ROBOT ASSISTED LAPAROSCOPIC RADICAL PROSTATECTOMY  09/24/2011   Procedure: ROBOTIC ASSISTED LAPAROSCOPIC RADICAL PROSTATECTOMY LEVEL 1;  Surgeon: Dutch Gray, MD;  Location: [facility];  Service: Urology;  Laterality: N/A;  . ULTRASOUND GUIDANCE FOR VASCULAR ACCESS  08/11/2018   Procedure: Ultrasound Guidance For Vascular Access;  Surgeon: Belva Crome, MD;  Location: [place] [facility];  Service: Cardiovascular;;    Current Medications: Current Meds  Medication Sig  . aspirin EC 81 MG tablet Take 1 tablet (81 mg  total) by mouth daily.  . clotrimazole-betamethasone (LOTRISONE) cream Apply 1 application topically 2 (two) times daily as needed (for itching).   . Coenzyme Q10 (COQ10) 100 MG CAPS Take 100 mg by mouth daily.  Marland Kitchen diltiazem (CARDIZEM CD) 120 MG 24 hr capsule Take 1 capsule (120 mg total) by mouth daily.  Marland Kitchen diltiazem (CARDIZEM) 30 MG tablet Take 1 tablet (30 mg total) by mouth 2 (two) times daily as needed (for breakthrough afib.).  Marland Kitchen flecainide (TAMBOCOR) 50  MG tablet Take 1 tablet (50 mg total) by mouth 2 (two) times daily.  . Multiple Vitamin (MULTIVITAMIN WITH MINERALS) TABS tablet Take 1 tablet by mouth 2 (two) times daily. DoTerra Vitamin  . Polyvinyl Alcohol-Povidone (REFRESH OP) Place 1 drop into both eyes as needed (for dry eyes).  . Potassium 99 MG TABS Take 99 mg by mouth daily as needed (for leg cramps in the summer once weekly).   . rosuvastatin (CRESTOR) 10 MG tablet TAKE 1 TABLET BY MOUTH EVERY DAY  . sildenafil (REVATIO) 20 MG tablet Take 20-60 mg by mouth daily as needed (for ED).   . TURMERIC PO Take 1,500 mg by mouth daily.     Allergies:   Patient has no known allergies.   Social History   Socioeconomic History  . Marital status: Divorced    Spouse name: Not on file  . Number of children: Not on file  . Years of education: Not on file  . Highest education level: Not on file  Occupational History  . Not on file  Tobacco Use  . Smoking status: Former Smoker    Quit date: 09/17/2001    Years since quitting: 18.2  . Smokeless tobacco: Never Used  Substance and Sexual Activity  . Alcohol use: Yes    Alcohol/week: 12.0 standard drinks    Types: 5 Glasses of wine, 7 Cans of beer per week    Comment: daily  . Drug use: No  . Sexual activity: Yes  Other Topics Concern  . 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 Difficulty of Paying Living Expenses: Not on file  Food Insecurity:   . Worried About Charity fundraiser in the Last Year: Not on file  . Ran Out of Food in the Last Year: Not on file  Transportation Needs:   . Lack of Transportation (Medical): Not on file  . Lack of Transportation (Non-Medical): Not on file  Physical Activity:   . Days of Exercise per Week: Not on file  . Minutes of Exercise per Session: Not on file  Stress:   . Feeling of Stress : Not on file  Social  Connections:   . Frequency of Communication with Friends and Family: Not on file  . Frequency of Social Gatherings with Friends and Family: Not on file  . Attends Religious Services: Not on file  . Active Member of Clubs or Organizations: Not on file  . Attends Archivist Meetings: Not on file  . Marital Status: Not on file     Family History: The patient's family history includes Prostate cancer in his father.  ROS:   Please see the history of present illness.    Some anxiety related to his heart health all other systems reviewed and are negative.  EKGs/Labs/Other Studies Reviewed:    The following studies were  reviewed today: Coronary angiography September 2019: Conclusion   Eccentric proximal 40 to 50% LAD.  The LAD system is otherwise normal.  Normal left main.  Normal circumflex.  Normal right coronary.  Generalized vasoconstriction noted in the right coronary.  There was generalized left anterior descending vasoconstriction prior to intracoronary nitroglycerin.  Low normal LV function with EF 50%.  LVEDP was normal.  RECOMMENDATIONS:   Eccentric soft plaque in the proximal LAD before any of the diagonal branches arise.  This needs to be treated with aggressive risk factor modification including lipid lowering with LDL less than 70.  Antiplatelet therapy if possible with aspirin 81 mg/day.  Monitor glycemic control.  Evaluate other less traditional risk factors including sleep apnea.   Diagnostic Dominance: Right    EKG:  EKG performed September 30, 2019 reveals normal sinus rhythm, biatrial abnormality, poor R wave progression, and otherwise normal in appearance.  Recent Labs: 01/12/2019: ALT 25 08/12/2019: BUN 14; Creatinine, Ser 1.05; Potassium 4.4; Sodium 142  Recent Lipid Panel    Component Value Date/Time   CHOL 128 08/12/2019 1327   TRIG 81 08/12/2019 1327   HDL 85 08/12/2019 1327   CHOLHDL 1.5 08/12/2019 1327   LDLCALC 27 08/12/2019 1327      Physical Exam:    VS:  BP 122/78   Pulse 70   Ht 5\' 8"  (1.727 m)   Wt 161 lb 12.8 oz (73.4 kg)   SpO2 97%   BMI 24.60 kg/m     Wt Readings from Last 3 Encounters:  12/02/19 161 lb 12.8 oz (73.4 kg)  09/30/19 163 lb (73.9 kg)  09/02/19 161 lb (73 kg)     GEN: Healthy-appearing. No acute distress HEENT: Normal NECK: No JVD. LYMPHATICS: No lymphadenopathy CARDIAC:  RRR without murmur, gallop, or edema. VASCULAR:  Normal Pulses. No bruits. RESPIRATORY:  Clear to auscultation without rales, wheezing or rhonchi  ABDOMEN: Soft, non-tender, non-distended, No pulsatile mass, MUSCULOSKELETAL: No deformity  SKIN: Warm and dry NEUROLOGIC:  Alert and oriented x 3 PSYCHIATRIC:  Normal affect   ASSESSMENT:    1. Paroxysmal atrial fibrillation (HCC)   2. Hyperlipidemia, unspecified hyperlipidemia type   3. Coronary artery disease involving native heart without angina pectoris, unspecified vessel or lesion type   4. Educated about COVID-19 virus infection    PLAN:    In order of problems listed above:  1. Currently well controlled following ablation. 2. Low-dose statin therapy to keep LDL less than 70.  Most recent was 46 in February 2020 3. Nonobstructive asymptomatic coronary disease.  Primary preventive therapy is recommended.  Please see below 4. 3W's and COVID-19 vaccine endorsed as lifestyle and disease preventive measures..  Overall education and awareness concerning primary/secondary risk prevention was discussed in detail: LDL less than 70, hemoglobin A1c less than 7, blood pressure target less than 130/80 mmHg, >150 minutes of moderate aerobic activity per week, avoidance of smoking, weight control (via diet and exercise), and continued surveillance/management of/for obstructive sleep apnea.  Clinical follow-up in 1 year.   Medication Adjustments/Labs and Tests Ordered: Current medicines are reviewed at length with the patient today.  Concerns regarding medicines are  outlined above.  No orders of the defined types were placed in this encounter.  No orders of the defined types were placed in this encounter.   Patient Instructions  Medication Instructions:  Your physician recommends that you continue on your current medications as directed. Please refer to the Current Medication list given to you today.  *  If you need a refill on your cardiac medications before your next appointment, please call your pharmacy*  Lab Work: None If you have labs (blood work) drawn today and your tests are completely normal, you will receive your results only by: Marland Kitchen MyChart Message (if you have MyChart) OR . A paper copy in the mail If you have any lab test that is abnormal or we need to change your treatment, we will call you to review the results.  Testing/Procedures: None  Follow-Up: At [facility], you and your health needs are our priority.  As part of our continuing mission to provide you with exceptional heart care, we have created designated Provider Care Teams.  These Care Teams include your primary Cardiologist (physician) and Advanced Practice Providers (APPs -  Physician Assistants and Nurse Practitioners) who all work together to provide you with the care you need, when you need it.  Your next appointment:   12 month(s)  The format for your next appointment:   In Person  Provider:   You may see Dr. Daneen Schick or one of the following Advanced Practice Providers on your designated Care Team:    Truitt Merle, NP  Cecilie Kicks, NP  Kathyrn Drown, NP   Other Instructions      Signed, Sinclair Grooms, MD  12/02/2019 4:08 PM    [place]

## 2019-12-02 ENCOUNTER — Ambulatory Visit (INDEPENDENT_AMBULATORY_CARE_PROVIDER_SITE_OTHER): Payer: 59 | Admitting: Interventional Cardiology

## 2019-12-02 ENCOUNTER — Other Ambulatory Visit: Payer: Self-pay

## 2019-12-02 ENCOUNTER — Encounter: Payer: Self-pay | Admitting: Interventional Cardiology

## 2019-12-02 VITALS — BP 122/78 | HR 70 | Ht 68.0 in | Wt 161.8 lb

## 2019-12-02 DIAGNOSIS — I480 Paroxysmal atrial fibrillation: Secondary | ICD-10-CM

## 2019-12-02 DIAGNOSIS — I2510 Atherosclerotic heart disease of native coronary artery without angina pectoris: Secondary | ICD-10-CM

## 2019-12-02 DIAGNOSIS — E785 Hyperlipidemia, unspecified: Secondary | ICD-10-CM | POA: Diagnosis not present

## 2019-12-02 DIAGNOSIS — Z7189 Other specified counseling: Secondary | ICD-10-CM

## 2019-12-02 NOTE — Patient Instructions (Signed)
Medication Instructions:  Your physician recommends that you continue on your current medications as directed. Please refer to the Current Medication list given to you today.  *If you need a refill on your cardiac medications before your next appointment, please call your pharmacy*  Lab Work: None If you have labs (blood work) drawn today and your tests are completely normal, you will receive your results only by: . MyChart Message (if you have MyChart) OR . A paper copy in the mail If you have any lab test that is abnormal or we need to change your treatment, we will call you to review the results.  Testing/Procedures: None  Follow-Up: At [facility], you and your health needs are our priority.  As part of our continuing mission to provide you with exceptional heart care, we have created designated Provider Care Teams.  These Care Teams include your primary Cardiologist (physician) and Advanced Practice Providers (APPs -  Physician Assistants and Nurse Practitioners) who all work together to provide you with the care you need, when you need it.  Your next appointment:   12 month(s)  The format for your next appointment:   In Person  Provider:   You may see Dr. Henry Smith or one of the following Advanced Practice Providers on your designated Care Team:    Lori Gerhardt, NP  Laura Ingold, NP  Jill McDaniel, NP   Other Instructions   

## 2019-12-29 ENCOUNTER — Other Ambulatory Visit: Payer: Self-pay | Admitting: Internal Medicine

## 2019-12-29 ENCOUNTER — Ambulatory Visit
Admission: RE | Admit: 2019-12-29 | Discharge: 2019-12-29 | Disposition: A | Discharge status: Home / Self Care | Payer: 59 | Source: Ambulatory Visit | Attending: Internal Medicine | Admitting: Internal Medicine

## 2019-12-29 DIAGNOSIS — M5412 Radiculopathy, cervical region: Secondary | ICD-10-CM

## 2020-02-09 ENCOUNTER — Ambulatory Visit (INDEPENDENT_AMBULATORY_CARE_PROVIDER_SITE_OTHER): Payer: 59 | Admitting: Neurology

## 2020-02-09 ENCOUNTER — Other Ambulatory Visit: Payer: Self-pay

## 2020-02-09 ENCOUNTER — Encounter (INDEPENDENT_AMBULATORY_CARE_PROVIDER_SITE_OTHER): Payer: 59 | Admitting: Neurology

## 2020-02-09 DIAGNOSIS — Z0289 Encounter for other administrative examinations: Secondary | ICD-10-CM

## 2020-02-09 DIAGNOSIS — R202 Paresthesia of skin: Secondary | ICD-10-CM

## 2020-02-09 NOTE — Procedures (Signed)
Full Name: Ricardo [name] Gender: Male MRN #: [id number] Date of Birth: 01-08-1956    Visit Date: 02/09/2020 11:44 Age: 64 Years Examining Physician: Marcial Pacas, MD  Referring Physician: Erline Levine, MD History: 64 years old male with history of bilateral ulnar decompression surgery, presented with few months history of neck pain, intermittent right or left arm and hand paresthesia.  On examination: He has right hand intrinsic muscle atrophy, there was no muscle weakness at bilateral proximal upper extremity muscles, mild bilateral finger abduction and grip weakness, right worse than left, Sensory examination was intact to vibratory sensation, light touch.  Deep tendon reflexes were brisk at bilateral upper and lower extremities and symmetric, bilateral plantar responses were flexor bilaterally.  Summary of the tests: Nerve conduction study: Right superficial peroneal sensory, right peroneal to EDB motor responses were normal.  Right ulnar sensory responses showed moderately prolonged peak latency with moderately decreased snap amplitude.  Right ulnar motor responses showed normal distal latency, CMAP amplitude at proximal stimulation site, 20 m/s velocity drop across right elbow.  Bilateral median, ulnar sensory and right ulnar motor responses were normal.  Right radial sensory responses were normal.   Electromyography Selected needle examinations were performed at bilateral upper extremity muscles, bilateral cervical paraspinals, right lower extremity muscles.  There was evidence of chronic neuropathic changes involving bilateral first dorsal interossei, flexor carpi ulnaris, abductor digital minimum; there is also chronic neuropathic changes involving right extensor digitorum communis, milder degree right pronator teres.  There was no significant abnormality noticed otherwise.  There was no evidence of spontaneous activity at bilateral cervical paraspinal  muscles.  Conclusion: This is an abnormal study.  There is electrodiagnostic evidence of chronic neuropathic changes involving right C7, 8, T1 myotomes.  Above findings could support a diagnosis of mild chronic right C6, 7, cervical radiculopathy, versus residual findings from previous bilateral ulnar decompression surgery.  There is no evidence of active process.    Marcial Pacas, M.D. PhD  [facility] [place], [place] [address] Tel: [phone] Fax: [phone]         [facility]    Nerve / Sites Muscle Latency Ref. Amplitude Ref. Rel Amp Segments Distance Velocity Ref. Area    ms ms mV mV %  cm m/s m/s mVms  L Median - APB     Wrist APB 3.3 ?4.4 10.0 ?4.0 100 Wrist - APB 7   26.5     Upper arm APB 7.0  9.8  97.5 Upper arm - Wrist 21 57 ?49 26.0  R Median - APB     Wrist APB 3.6 ?4.4 9.3 ?4.0 100 Wrist - APB 7   27.8     Upper arm APB 7.3  9.1  97.1 Upper arm - Wrist 22 59 ?49 27.0  L Ulnar - ADM     Wrist ADM 2.5 ?3.3 11.6 ?6.0 100 Wrist - ADM 7   31.9     B.Elbow ADM 5.9  11.2  96.3 B.Elbow - Wrist 21 62 ?49 30.1     A.Elbow ADM 7.8  11.0  98.2 A.Elbow - B.Elbow 10 52 ?49 30.0         A.Elbow - Wrist      R Ulnar - ADM     Wrist ADM 3.0 ?3.3 6.2 ?6.0 100 Wrist - ADM 7   18.3     B.Elbow ADM 6.7  3.8  60.5 B.Elbow - Wrist 20 54 ?49 16.3  A.Elbow ADM 9.7  4.1  108 A.Elbow - B.Elbow 10 34 ?49 17.3         A.Elbow - Wrist      R Peroneal - EDB     Ankle EDB 5.8 ?6.5 3.0 ?2.0 100 Ankle - EDB 9   7.2     Fib head EDB 12.0  2.8  94.3 Fib head - Ankle 30 48 ?44 7.5     Pop fossa EDB 14.1  2.7  97.1 Pop fossa - Fib head 10 48 ?44 7.2         Pop fossa - Ankle                   SNC    Nerve / Sites Rec. Site Peak Lat Ref.  Amp Ref. Segments Distance Peak Diff Ref.    ms ms V V  cm ms ms  R Radial - Anatomical snuff box (Forearm)     Forearm Wrist 2.4 ?2.9 16 ?15 Forearm - Wrist 10    R Superficial peroneal - Ankle     Lat leg Ankle 3.7 ?4.4 9 ?6  Lat leg - Ankle 14    L Median, Ulnar - Transcarpal comparison     Median Palm Wrist 2.1 ?2.2 61 ?35 Median Palm - Wrist 8       Ulnar Palm Wrist 2.1 ?2.2 25 ?12 Ulnar Palm - Wrist 8          Median Palm - Ulnar Palm  -0.0 ?0.4  R Median, Ulnar - Transcarpal comparison     Median Palm Wrist 2.2 ?2.2 53 ?35 Median Palm - Wrist 8       Ulnar Palm Wrist 2.7 ?2.2 4 ?12 Ulnar Palm - Wrist 8          Median Palm - Ulnar Palm  -0.5 ?0.4  L Median - Orthodromic (Dig II, Mid palm)     Dig II Wrist 2.9 ?3.4 17 ?10 Dig II - Wrist 13    R Median - Orthodromic (Dig II, Mid palm)     Dig II Wrist 3.1 ?3.4 12 ?10 Dig II - Wrist 13    L Ulnar - Orthodromic, (Dig V, Mid palm)     Dig V Wrist 2.8 ?3.1 6 ?5 Dig V - Wrist 11    R Ulnar - Orthodromic, (Dig V, Mid palm)     Dig V Wrist 3.9 ?3.1 2 ?5 Dig V - Wrist 58                       F  Wave    Nerve F Lat Ref.   ms ms  L Ulnar - ADM 31.6 ?32.0  R Ulnar - ADM 35.8 ?32.0  R Tibial - AH 50.4 ?56.0           H Reflex    Nerve H Lat   ms   Left Right Ref.  Tibial - Soleus 30.1 29.7 ?35.0         EMG Summary Table    Spontaneous MUAP Recruitment  Muscle IA Fib PSW Fasc Other Amp Dur. Poly Pattern  R. First dorsal interosseous Increased None None None _______ Increased Normal Normal Reduced  R. Abductor digiti minimi (manus) Normal None None None _______ Normal Normal Normal Reduced  R. Abductor pollicis brevis Normal None None None _______ Normal Normal Normal Normal  R. Pronator teres Normal None None None _______ Normal Normal Normal Reduced  R. Extensor digitorum communis Normal None None None _______ Normal Normal Normal Reduced  R. Biceps brachii Normal None None None _______ Normal Normal Normal Normal  R. Deltoid Normal None None None _______ Normal Normal Normal Normal  R. Triceps brachii Normal None None None _______ Normal Normal Normal Normal  R. Cervical paraspinals Normal None None None _______ Normal Normal Normal Normal  L. First  dorsal interosseous Normal None None None _______ Normal Normal Normal Reduced  L. Abductor pollicis brevis Normal None None None _______ Normal Normal Normal Normal  L. Pronator teres Normal None None None _______ Normal Normal Normal Normal  L. Flexor carpi ulnaris Normal None None None _______ Normal Normal Normal Reduced  L. Biceps brachii Normal None None None _______ Normal Normal Normal Normal  L. Deltoid Normal None None None _______ Normal Normal Normal Normal  L. Triceps brachii Normal None None None _______ Normal Normal Normal Normal  L. Cervical paraspinals Normal None None None _______ Normal Normal Normal Normal  R. Flexor carpi ulnaris Normal None None None _______ Normal Normal Normal Reduced  R. Tibialis anterior Normal None None None _______ Normal Normal Normal Normal  R. Gastrocnemius (Medial head) Normal None None None _______ Normal Normal Normal Normal  R. Vastus lateralis Normal None None None _______ Normal Normal Normal Normal

## 2020-03-10 ENCOUNTER — Other Ambulatory Visit: Payer: Self-pay | Admitting: Neurosurgery

## 2020-03-10 ENCOUNTER — Encounter: Payer: Self-pay | Admitting: Interventional Cardiology

## 2020-03-10 ENCOUNTER — Telehealth: Payer: Self-pay | Admitting: Internal Medicine

## 2020-03-10 DIAGNOSIS — M5412 Radiculopathy, cervical region: Secondary | ICD-10-CM

## 2020-03-10 NOTE — Telephone Encounter (Signed)
Error

## 2020-03-10 NOTE — Telephone Encounter (Signed)
Patient informed he has an MRI compatible LINQ implant.

## 2020-03-10 NOTE — Telephone Encounter (Signed)
LMOM to call office on home phone. Unable to leave message on cell phone due to full mailbox.   OK to have MRI with LINQ implant.

## 2020-03-10 NOTE — Telephone Encounter (Signed)
Ricardo [name] is calling wanting to know if it is okay for him to get a MRI of his neck done with the loop recorder Dr. Rayann Heman put in. Please advise.

## 2020-03-24 ENCOUNTER — Other Ambulatory Visit: Payer: Self-pay

## 2020-03-24 ENCOUNTER — Ambulatory Visit
Admission: RE | Admit: 2020-03-24 | Discharge: 2020-03-24 | Disposition: A | Discharge status: Home / Self Care | Payer: 59 | Source: Ambulatory Visit | Attending: Neurosurgery | Admitting: Neurosurgery

## 2020-03-24 DIAGNOSIS — M5412 Radiculopathy, cervical region: Secondary | ICD-10-CM

## 2020-08-18 ENCOUNTER — Other Ambulatory Visit: Payer: Self-pay | Admitting: Student

## 2020-09-12 ENCOUNTER — Other Ambulatory Visit: Payer: Self-pay

## 2020-09-12 ENCOUNTER — Ambulatory Visit (INDEPENDENT_AMBULATORY_CARE_PROVIDER_SITE_OTHER): Payer: 59 | Admitting: Internal Medicine

## 2020-09-12 ENCOUNTER — Encounter: Payer: Self-pay | Admitting: *Deleted

## 2020-09-12 VITALS — BP 134/80 | HR 80 | Wt 156.0 lb

## 2020-09-12 DIAGNOSIS — I480 Paroxysmal atrial fibrillation: Secondary | ICD-10-CM

## 2020-09-12 LAB — CUP PACEART INCLINIC DEVICE CHECK
Date Time Interrogation Session: 20211025164920
Implantable Pulse Generator Implant Date: 20181218

## 2020-09-12 NOTE — Patient Instructions (Addendum)
Medication Instructions:  Your physician recommends that you continue on your current medications as directed. Please refer to the Current Medication list given to you today.  *If you need a refill on your cardiac medications before your next appointment, please call your pharmacy*  Lab Work: None ordered.  If you have labs (blood work) drawn today and your tests are completely normal, you will receive your results only by: Marland Kitchen MyChart Message (if you have MyChart) OR . A paper copy in the mail If you have any lab test that is abnormal or we need to change your treatment, we will call you to review the results.  Testing/Procedures: None ordered.  Follow-Up: At [facility], you and your health needs are our priority.  As part of our continuing mission to provide you with exceptional heart care, we have created designated Provider Care Teams.  These Care Teams include your primary Cardiologist (physician) and Advanced Practice Providers (APPs -  Physician Assistants and Nurse Practitioners) who all work together to provide you with the care you need, when you need it.  We recommend signing up for the patient portal called "MyChart".  Sign up information is provided on this After Visit Summary.  MyChart is used to connect with patients for Virtual Visits (Telemedicine).  Patients are able to view lab/test results, encounter notes, upcoming appointments, etc.  Non-urgent messages can be sent to your provider as well.   To learn more about what you can do with MyChart, go to [email or web address].    Your next appointment:   Your physician wants you to follow-up in: 08/21/21 at 4:15 pm with Dr. Rayann Heman.   Other Instructions:

## 2020-09-12 NOTE — Progress Notes (Signed)
PCP: Leda Quail., MD Primary Cardiologist: Dr Tamala Julian Primary EP: Dr Rayann Heman  Ricardo [name] is a 64 y.o. male who presents today for routine electrophysiology followup.  Since last being seen in our clinic, the patient reports doing very well.  He is active.  He is planning on retiring in a few months.  Remains active.  His father died a month ago.  His mother recently broke her hip.  Today, he denies symptoms of palpitations, chest pain, shortness of breath,  lower extremity edema, dizziness, presyncope, or syncope.  The patient is otherwise without complaint today.   Past Medical History:  Diagnosis Date  . Carpal tunnel syndrome 11-03-13   bilateral numbness-worse while sleep-remains an issue  . History of kidney stones    '12 x 1 episode  . Left inguinal hernia   . Paroxysmal atrial fibrillation (HCC)   . Prostate cancer [facility])    prostate   Past Surgical History:  Procedure Laterality Date  . ATRIAL FIBRILLATION ABLATION N/A 01/22/2017   Procedure: Atrial Fibrillation Ablation;  Surgeon: Thompson Grayer, MD;  Location: [place] [facility];  Service: Cardiovascular;  Laterality: N/A;  . ATRIAL FIBRILLATION ABLATION  10/09/2018  . ATRIAL FIBRILLATION ABLATION N/A 10/09/2018   Procedure: ATRIAL FIBRILLATION ABLATION;  Surgeon: Thompson Grayer, MD;  Location: [place] [facility];  Service: Cardiovascular;  Laterality: N/A;  . COLONOSCOPY  03/2006; 07/16/2011   2007: rectal adenoma 2012:Normal  . ELBOW SURGERY     bilateral ulnar nerve release  . HERNIA REPAIR Left 11/06/13   [facility]  . INGUINAL HERNIA REPAIR Left 11/06/2013   Procedure: HERNIA REPAIR INGUINAL ADULT;  Surgeon: Odis Hollingshead, MD;  [facility];  Service: General;  Laterality: Left;  . INSERTION OF MESH Left 11/06/2013   Procedure: INSERTION OF MESH;  Surgeon: Odis Hollingshead, MD;  [facility];  Service: General;  Laterality: Left;  . KNEE ARTHROSCOPY     left  . LEFT HEART CATH AND CORONARY  ANGIOGRAPHY N/A 08/11/2018   Procedure: LEFT HEART CATH AND CORONARY ANGIOGRAPHY;  Surgeon: Belva Crome, MD;  Location: [place] [facility];  Service: Cardiovascular;  Laterality: N/A;  . LOOP RECORDER INSERTION Left 11/05/2017   Procedure: LOOP RECORDER INSERTION;  Surgeon: Thompson Grayer, MD;  Location: [place] [facility];  Service: Cardiovascular;  Laterality: Left;  . PROSTATE BIOPSY  05/04/2011   adenocarcinoma Gleason 6  . ROBOT ASSISTED LAPAROSCOPIC RADICAL PROSTATECTOMY  09/24/2011   Procedure: ROBOTIC ASSISTED LAPAROSCOPIC RADICAL PROSTATECTOMY LEVEL 1;  Surgeon: [place] Gray, MD;  [facility];  Service: Urology;  Laterality: N/A;  . ULTRASOUND GUIDANCE FOR VASCULAR ACCESS  08/11/2018   Procedure: Ultrasound Guidance For Vascular Access;  Surgeon: Belva Crome, MD;  Location: [place] [facility];  Service: Cardiovascular;;    ROS- all systems are reviewed and negatives except as per HPI above  Current Outpatient Medications  Medication Sig Dispense Refill  . aspirin EC 81 MG tablet Take 1 tablet (81 mg total) by mouth daily. 90 tablet 3  . clotrimazole-betamethasone (LOTRISONE) cream Apply 1 application topically 2 (two) times daily as needed (for itching).   0  . Coenzyme Q10 (COQ10) 100 MG CAPS Take 100 mg by mouth daily.    Marland Kitchen diltiazem (CARDIZEM CD) 120 MG 24 hr capsule Take 1 capsule (120 mg total) by mouth daily. Please make yearly appt with Dr. Rayann Heman for November for future refills. 1st attempt 90 capsule 0  .  diltiazem (CARDIZEM) 30 MG tablet Take 1 tablet (30 mg total) by mouth 2 (two) times daily as needed (for breakthrough afib.). 180 tablet 3  . flecainide (TAMBOCOR) 50 MG tablet Take 1 tablet (50 mg total) by mouth 2 (two) times daily. 180 tablet 3  . Multiple Vitamin (MULTIVITAMIN WITH MINERALS) TABS tablet Take 1 tablet by mouth 2 (two) times daily. DoTerra Vitamin    . Polyvinyl Alcohol-Povidone (REFRESH OP) Place 1 drop into both eyes as needed (for dry eyes).    .  Potassium 99 MG TABS Take 99 mg by mouth daily as needed (for leg cramps in the summer once weekly).     . rosuvastatin (CRESTOR) 10 MG tablet TAKE 1 TABLET BY MOUTH EVERY DAY 90 tablet 3  . sildenafil (REVATIO) 20 MG tablet Take 20-60 mg by mouth daily as needed (for ED).     . TURMERIC PO Take 1,500 mg by mouth daily.     No current facility-administered medications for this visit.    Physical Exam: Vitals:   09/12/20 1625  BP: 134/80  Pulse: 80  Weight: 156 lb (70.8 kg)     GEN- The patient is well appearing, alert and oriented x 3 today.   Head- normocephalic, atraumatic Eyes-  Sclera clear, conjunctiva pink Ears- hearing intact Oropharynx- clear Lungs- Clear to ausculation bilaterally, normal work of breathing Heart- Regular rate and rhythm, no murmurs, rubs or gallops, PMI not laterally displaced GI- soft, NT, ND, + BS Extremities- no clubbing, cyanosis, or edema  Wt Readings from Last 3 Encounters:  09/12/20 156 lb (70.8 kg)  12/02/19 161 lb 12.8 oz (73.4 kg)  09/30/19 163 lb (73.9 kg)    EKG tracing ordered today is personally reviewed and shows sinus rhythm  Assessment and Plan:  1. Paroxysmal atrial fibrillation Doing well s/p ablation chads2vasc score is 0, though he will be 64 yo later this week afib burden is 6.6 % since starting flecainide (previously 13.8% in 2020)  2. HL He does not have obstructive CAD  Continue crestor  Risks, benefits and potential toxicities for medications prescribed and/or refilled reviewed with patient today.   Return in a year  Thompson Grayer MD, [facility] 09/12/2020 4:25 PM

## 2020-11-03 ENCOUNTER — Other Ambulatory Visit: Payer: Self-pay | Admitting: Internal Medicine

## 2020-11-16 ENCOUNTER — Other Ambulatory Visit: Payer: Self-pay | Admitting: Internal Medicine

## 2020-11-17 ENCOUNTER — Other Ambulatory Visit: Payer: Self-pay | Admitting: Student

## 2020-11-17 DIAGNOSIS — I480 Paroxysmal atrial fibrillation: Secondary | ICD-10-CM

## 2020-12-28 ENCOUNTER — Other Ambulatory Visit: Payer: Self-pay | Admitting: Student

## 2020-12-28 DIAGNOSIS — I480 Paroxysmal atrial fibrillation: Secondary | ICD-10-CM

## 2020-12-31 ENCOUNTER — Other Ambulatory Visit: Payer: Self-pay | Admitting: Internal Medicine

## 2020-12-31 DIAGNOSIS — I480 Paroxysmal atrial fibrillation: Secondary | ICD-10-CM

## 2021-06-28 ENCOUNTER — Telehealth: Payer: Self-pay | Admitting: Internal Medicine

## 2021-06-28 NOTE — Telephone Encounter (Signed)
New Message:    Patient have Covid and his doctor wants him to take Paxlovid. He was told to check and see if he could take this. He said the doctor said it might would interfere with his Flecainide.

## 2021-06-28 NOTE — Telephone Encounter (Signed)
Paxlovid is contraindicated to use with flecainide due to increased serum concentrations of flecainide. Would need input from MD regarding potential interruption of flecainide therapy.  Paxlovid also increases concentrations of diltiazem and rosuvastatin - pt would need to monitor HR, BP, and watch for signs of myalgias if he starts Paxlovid. Sildenafil should also be avoided if he takes Paxlovid.

## 2021-06-29 NOTE — Telephone Encounter (Signed)
Calling the patient to advise him that, after speaking with the pharmacist and Dr. Rayann Heman that it is recommended to not take the Paxlovid.

## 2021-07-18 NOTE — Telephone Encounter (Signed)
Left message.  Closing out call at this time.

## 2021-07-26 ENCOUNTER — Emergency Department (HOSPITAL_BASED_OUTPATIENT_CLINIC_OR_DEPARTMENT_OTHER)
Admission: EM | Admit: 2021-07-26 | Discharge: 2021-07-27 | Disposition: A | Discharge status: Home / Self Care | Payer: 59 | Attending: Emergency Medicine | Admitting: Emergency Medicine

## 2021-07-26 ENCOUNTER — Emergency Department (HOSPITAL_BASED_OUTPATIENT_CLINIC_OR_DEPARTMENT_OTHER): Payer: 59

## 2021-07-26 ENCOUNTER — Encounter (HOSPITAL_BASED_OUTPATIENT_CLINIC_OR_DEPARTMENT_OTHER): Payer: Self-pay | Admitting: Obstetrics and Gynecology

## 2021-07-26 ENCOUNTER — Other Ambulatory Visit: Payer: Self-pay

## 2021-07-26 DIAGNOSIS — Z7982 Long term (current) use of aspirin: Secondary | ICD-10-CM | POA: Diagnosis not present

## 2021-07-26 DIAGNOSIS — Z8546 Personal history of malignant neoplasm of prostate: Secondary | ICD-10-CM | POA: Insufficient documentation

## 2021-07-26 DIAGNOSIS — Z86010 Personal history of colonic polyps: Secondary | ICD-10-CM | POA: Insufficient documentation

## 2021-07-26 DIAGNOSIS — N23 Unspecified renal colic: Secondary | ICD-10-CM | POA: Insufficient documentation

## 2021-07-26 DIAGNOSIS — R1030 Lower abdominal pain, unspecified: Secondary | ICD-10-CM | POA: Diagnosis present

## 2021-07-26 DIAGNOSIS — Z87891 Personal history of nicotine dependence: Secondary | ICD-10-CM | POA: Insufficient documentation

## 2021-07-26 DIAGNOSIS — Z87442 Personal history of urinary calculi: Secondary | ICD-10-CM | POA: Diagnosis not present

## 2021-07-26 HISTORY — DX: Disorder of kidney and ureter, unspecified: N28.9

## 2021-07-26 LAB — URINALYSIS, ROUTINE W REFLEX MICROSCOPIC
Bilirubin Urine: NEGATIVE
Glucose, UA: NEGATIVE mg/dL
Hgb urine dipstick: NEGATIVE
Leukocytes,Ua: NEGATIVE
Nitrite: NEGATIVE
Specific Gravity, Urine: 1.027 (ref 1.005–1.030)
pH: 6.5 (ref 5.0–8.0)

## 2021-07-26 LAB — COMPREHENSIVE METABOLIC PANEL
ALT: 21 U/L (ref 0–44)
AST: 21 U/L (ref 15–41)
Albumin: 4.5 g/dL (ref 3.5–5.0)
Alkaline Phosphatase: 66 U/L (ref 38–126)
Anion gap: 13 (ref 5–15)
BUN: 18 mg/dL (ref 8–23)
CO2: 20 mmol/L — ABNORMAL LOW (ref 22–32)
Calcium: 9.8 mg/dL (ref 8.9–10.3)
Chloride: 105 mmol/L (ref 98–111)
Creatinine, Ser: 1.08 mg/dL (ref 0.61–1.24)
GFR, Estimated: >60 mL/min (ref >60)
Glucose, Bld: 119 mg/dL — ABNORMAL HIGH (ref 70–99)
Potassium: 3.8 mmol/L (ref 3.5–5.1)
Sodium: 138 mmol/L (ref 135–145)
Total Bilirubin: 0.8 mg/dL (ref 0.3–1.2)
Total Protein: 7.2 g/dL (ref 6.5–8.1)

## 2021-07-26 LAB — CBC
HCT: 41.2 % (ref 39.0–52.0)
Hemoglobin: 14.8 g/dL (ref 13.0–17.0)
MCH: 33.3 pg (ref 26.0–34.0)
MCHC: 35.9 g/dL (ref 30.0–36.0)
MCV: 92.8 fL (ref 80.0–100.0)
Platelets: 150 10*3/uL (ref 150–400)
RBC: 4.44 MIL/uL (ref 4.22–5.81)
RDW: 13.1 % (ref 11.5–15.5)
WBC: 8.4 10*3/uL (ref 4.0–10.5)
nRBC: 0 % (ref 0.0–0.2)

## 2021-07-26 LAB — LIPASE, BLOOD: Lipase: 23 U/L (ref 11–51)

## 2021-07-26 MED ORDER — ONDANSETRON 4 MG PO TBDP
4.0000 mg | ORAL_TABLET | Freq: Once | ORAL | Status: AC | PRN
  Administered 2021-07-26: 4 mg via ORAL
  Filled 2021-07-26: qty 1

## 2021-07-26 MED ORDER — ONDANSETRON 8 MG PO TBDP: 8.0000 mg | ORAL_TABLET | Freq: Three times a day (TID) | ORAL | 0 refills | Status: DC | PRN

## 2021-07-26 MED ORDER — OXYCODONE-ACETAMINOPHEN 5-325 MG PO TABS
1.0000 | ORAL_TABLET | ORAL | Status: DC | PRN
  Filled 2021-07-26: qty 1

## 2021-07-26 MED ORDER — OXYCODONE-ACETAMINOPHEN 5-325 MG PO TABS: 1.0000 | ORAL_TABLET | Freq: Four times a day (QID) | ORAL | 0 refills | Status: DC | PRN

## 2021-07-26 MED ORDER — KETOROLAC TROMETHAMINE 30 MG/ML IJ SOLN
30.0000 mg | Freq: Once | INTRAMUSCULAR | Status: AC
  Administered 2021-07-26: 30 mg via INTRAMUSCULAR

## 2021-07-26 MED ORDER — KETOROLAC TROMETHAMINE 30 MG/ML IJ SOLN
30.0000 mg | Freq: Once | INTRAMUSCULAR | Status: DC
  Filled 2021-07-26: qty 1

## 2021-07-26 NOTE — ED Triage Notes (Signed)
Patient reports abdominal/flank/ and groin pain that started around 5pm today. Patient states he is concerned for kidney stone. Patient states he tries to urinate and it dribbles

## 2021-07-26 NOTE — Discharge Instructions (Addendum)
Please call Dr. Alinda Money in the morning for follow-up Return if you are unable to tolerate your pain medicines began having fever or have worsening symptoms

## 2021-07-26 NOTE — ED Provider Notes (Signed)
[facility] EMERGENCY DEPT Provider Note   CSN: [id number] Arrival date & time: 07/26/21  1858     History Chief Complaint  Patient presents with   Groin Pain   Abdominal Pain    Ricardo [name] is a 65 y.o. male.  HPI 65 year old male history of kidney stone on left, paroxysmal A. fib, not on blood thinners presents today complaining of right flank pain with radiation to the testicle similar to prior pain with kidney stone.  Pain started abruptly on way home from work earlier today.  He has had severe pain with nausea and vomiting.  He has not taken any medications prior to coming into the ED.  He denies any blood in his urine.  He has had some decreased ability to void.     Past Medical History:  Diagnosis Date   Carpal tunnel syndrome 11/03/2013   bilateral numbness-worse while sleep-remains an issue   History of kidney stones    '12 x 1 episode   Left inguinal hernia    Paroxysmal atrial fibrillation (HCC)    Prostate cancer [facility])    prostate   Renal disorder     Patient Active Problem List   Diagnosis Date Noted   Paresthesia 02/09/2020   Coronary artery disease involving coronary bypass graft of native heart with angina pectoris ([place]) 10/23/2018   Hyperlipidemia 08/18/2018   Chest pain 08/10/2018   Shortness of breath 08/10/2018   A-fib ([place]) 01/22/2017   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   Prostate cancer ([place]) 07/16/2011   Paroxysmal atrial fibrillation ([place]) 07/16/2011   Personal history of colonic polyps 04/11/2006    Past Surgical History:  Procedure Laterality Date   ATRIAL FIBRILLATION ABLATION N/A 01/22/2017   Procedure: Atrial Fibrillation Ablation;  Surgeon: Thompson Grayer, MD;  Location: [place] [facility];  Service: Cardiovascular;  Laterality: N/A;   ATRIAL FIBRILLATION ABLATION  10/09/2018   ATRIAL FIBRILLATION ABLATION N/A 10/09/2018   Procedure: ATRIAL FIBRILLATION ABLATION;  Surgeon: Thompson Grayer, MD;   Location: [place] [facility];  Service: Cardiovascular;  Laterality: N/A;   COLONOSCOPY  03/2006; 07/16/2011   2007: rectal adenoma 2012:Normal   ELBOW SURGERY     bilateral ulnar nerve release   HERNIA REPAIR Left 11/06/13   [facility]   INGUINAL HERNIA REPAIR Left 11/06/2013   Procedure: HERNIA REPAIR INGUINAL ADULT;  Surgeon: Odis Hollingshead, MD;  [facility];  Service: General;  Laterality: Left;   INSERTION OF MESH Left 11/06/2013   Procedure: INSERTION OF MESH;  Surgeon: Odis Hollingshead, MD;  [facility];  Service: General;  Laterality: Left;   KNEE ARTHROSCOPY     left   LEFT HEART CATH AND CORONARY ANGIOGRAPHY N/A 08/11/2018   Procedure: LEFT HEART CATH AND CORONARY ANGIOGRAPHY;  Surgeon: Belva Crome, MD;  Location: [place] [facility];  Service: Cardiovascular;  Laterality: N/A;   LOOP RECORDER INSERTION Left 11/05/2017   Procedure: LOOP RECORDER INSERTION;  Surgeon: Thompson Grayer, MD;  Location: [place] [facility];  Service: Cardiovascular;  Laterality: Left;   PROSTATE BIOPSY  05/04/2011   adenocarcinoma Gleason 6   ROBOT ASSISTED LAPAROSCOPIC RADICAL PROSTATECTOMY  09/24/2011   Procedure: ROBOTIC ASSISTED LAPAROSCOPIC RADICAL PROSTATECTOMY LEVEL 1;  Surgeon: [place] Gray, MD;  [facility];  Service: Urology;  Laterality: N/A;   ULTRASOUND GUIDANCE FOR VASCULAR ACCESS  08/11/2018   Procedure: Ultrasound Guidance For Vascular Access;  Surgeon: Belva Crome, MD;  Location: [place] [facility];  Service: Cardiovascular;;       Family History  Problem Relation Age of Onset   Prostate cancer Father     Social History   Tobacco Use   Smoking status: Former    Types: Cigarettes    Quit date: 09/17/2001    Years since quitting: 19.8   Smokeless tobacco: Never  Vaping Use   Vaping Use: Never used  Substance Use Topics   Alcohol use: Yes    Alcohol/week: 12.0 standard drinks    Types: 5 Glasses of wine, 7 Cans of beer per week    Comment: daily   Drug use: No     Home Medications Prior to Admission medications   Medication Sig Start Date End Date Taking? Authorizing Provider  aspirin EC 81 MG tablet Take 1 tablet (81 mg total) by mouth daily. 01/12/19   Allred, Jeneen Rinks, MD  clotrimazole-betamethasone (LOTRISONE) cream Apply 1 application topically 2 (two) times daily as needed (for itching).  10/23/17   [provider]  Coenzyme Q10 (COQ10) 100 MG CAPS Take 100 mg by mouth daily.    [provider]  diltiazem (CARDIZEM CD) 120 MG 24 hr capsule TAKE 1 CAPSULE DAILY. PLEASE MAKE YEARLY APPT WITH DR. ALLRED FOR NOVEMBER FOR FUTURE REFILLS. 11/16/20   Allred, Jeneen Rinks, MD  diltiazem (CARDIZEM) 30 MG tablet Take 1 tablet (30 mg total) by mouth 2 (two) times daily as needed (for breakthrough afib.). 09/30/19   Shirley Friar, PA-C  flecainide (TAMBOCOR) 50 MG tablet TAKE 1 TABLET BY MOUTH TWICE A DAY 01/03/21   Allred, Jeneen Rinks, MD  Multiple Vitamin (MULTIVITAMIN WITH MINERALS) TABS tablet Take 1 tablet by mouth 2 (two) times daily. DoTerra Vitamin    [provider]  Polyvinyl Alcohol-Povidone (REFRESH OP) Place 1 drop into both eyes as needed (for dry eyes).    [provider]  Potassium 99 MG TABS Take 99 mg by mouth daily as needed (for leg cramps in the summer once weekly).     [provider]  rosuvastatin (CRESTOR) 10 MG tablet TAKE 1 TABLET BY MOUTH EVERY DAY 11/03/20   Allred, Jeneen Rinks, MD  sildenafil (REVATIO) 20 MG tablet Take 20-60 mg by mouth daily as needed (for ED).     [provider]  TURMERIC PO Take 1,500 mg by mouth daily.    [provider]    Allergies    Patient has no known allergies.  Review of Systems   Review of Systems  All other systems reviewed and are negative.  Physical Exam Updated Vital Signs BP (!) 160/86 (BP Location: Right Arm)   Pulse 77   Temp 97.8 F (36.6 C)   Resp 18   SpO2 98%   Physical Exam Vitals and nursing note reviewed.   Constitutional:      Appearance: He is well-developed.  HENT:     Head: Normocephalic and atraumatic.     Right Ear: External ear normal.     Left Ear: External ear normal.     Nose: Nose normal.  Eyes:     Extraocular Movements: Extraocular movements intact.  Neck:     Trachea: No tracheal deviation.  Pulmonary:     Effort: Pulmonary effort is normal.  Abdominal:     General: Abdomen is flat. Bowel sounds are normal.     Palpations: Abdomen is soft.  Musculoskeletal:        General: Normal range of motion.  Skin:    General: Skin is warm and  dry.  Neurological:     Mental Status: He is alert and oriented to person, place, and time.  Psychiatric:        Mood and Affect: Mood normal.        Behavior: Behavior normal.    ED Results / Procedures / Treatments   Labs (all labs ordered are listed, but only abnormal results are displayed) Labs Reviewed  COMPREHENSIVE METABOLIC PANEL - Abnormal; Notable for the following components:      Result Value   CO2 20 (*)    Glucose, Bld 119 (*)    All other components within normal limits  URINALYSIS, ROUTINE W REFLEX MICROSCOPIC - Abnormal; Notable for the following components:   Ketones, ur TRACE (*)    Protein, ur TRACE (*)    All other components within normal limits  LIPASE, BLOOD  CBC    EKG None  Radiology CT Renal Stone Study  Result Date: 07/26/2021 CLINICAL DATA:  Right-sided flank pain EXAM: CT ABDOMEN AND PELVIS WITHOUT CONTRAST TECHNIQUE: Multidetector CT imaging of the abdomen and pelvis was performed following the standard protocol without IV contrast. COMPARISON:  CT 06/28/2011 FINDINGS: Lower chest: No acute abnormality. Hepatobiliary: No focal liver abnormality is seen. No gallstones, gallbladder wall thickening, or biliary dilatation. Pancreas: Unremarkable. No pancreatic ductal dilatation or surrounding inflammatory changes. Spleen: Normal in size without focal abnormality. Adrenals/Urinary Tract: Adrenal glands  are normal. Mild to moderate right hydronephrosis and hydroureter, secondary to a 5 mm stone in the distal right ureter, less than a cm proximal to the right UVJ. Urinary bladder is empty. Stomach/Bowel: Stomach is within normal limits. Appendix appears normal. No evidence of bowel wall thickening, distention, or inflammatory changes. Vascular/Lymphatic: Mild aortic atherosclerosis. No aneurysm. No suspicious nodes Reproductive: Prostate is unremarkable. Other: Negative for pelvic effusion or free air. Small fat containing right inguinal hernia Musculoskeletal: No acute or significant osseous findings. Scoliosis IMPRESSION: Mild to moderate right hydronephrosis and hydroureter, secondary to a 5 mm stone in the distal right ureter just proximal to the right UVJ. Electronically Signed   By: Donavan Foil M.D.   On: 07/26/2021 20:34    Procedures Procedures   Medications Ordered in ED Medications  ondansetron (ZOFRAN-ODT) disintegrating tablet 4 mg (4 mg Oral Given 07/26/21 1957)  ketorolac (TORADOL) 30 MG/ML injection 30 mg (30 mg Intramuscular Given 07/26/21 2022)    ED Course  I have reviewed the triage vital signs and the nursing notes.  Pertinent labs & imaging results that were available during my care of the patient were reviewed by me and considered in my medical decision making (see chart for details).    MDM Rules/Calculators/A&P                           Patient presents today with renal colic.  Patient has a 5 mm stone at right distal UVJ.  Patient received Toradol 30 mg with pain decreased from 10-1.  He has been followed in the past by Dr. Alinda Money.  He is given a prescription for Percocet and Zofran.  He is advised regarding return precautions and need for follow-up and voices understanding. Final Clinical Impression(s) / ED Diagnoses Final diagnoses:  Ureteral colic    Rx / DC Orders ED Discharge Orders     None        Pattricia Boss, MD 07/26/21 2110

## 2021-08-09 ENCOUNTER — Other Ambulatory Visit: Payer: Self-pay

## 2021-08-09 ENCOUNTER — Encounter: Payer: Self-pay | Admitting: *Deleted

## 2021-08-09 ENCOUNTER — Ambulatory Visit (INDEPENDENT_AMBULATORY_CARE_PROVIDER_SITE_OTHER): Payer: 59 | Admitting: Internal Medicine

## 2021-08-09 VITALS — BP 122/72 | HR 74 | Ht 68.0 in | Wt 160.2 lb

## 2021-08-09 DIAGNOSIS — I480 Paroxysmal atrial fibrillation: Secondary | ICD-10-CM | POA: Diagnosis not present

## 2021-08-09 DIAGNOSIS — E785 Hyperlipidemia, unspecified: Secondary | ICD-10-CM | POA: Diagnosis not present

## 2021-08-09 NOTE — Patient Instructions (Addendum)
Medication Instructions:  Your physician recommends that you continue on your current medications as directed. Please refer to the Current Medication list given to you today.  Labwork: None ordered.  Testing/Procedures: None ordered.  Follow-Up: Your physician wants you to follow-up in: 6 months with  one of the following Advanced Practice Providers on your designated Care Team:     Legrand Como "Jonni Sanger" Chalmers Cater, [place]   You will receive a reminder letter in the mail two months in advance. If you don't receive a letter, please call our office to schedule the follow-up appointment.   Any Other Special Instructions Will Be Listed Below (If Applicable).  If you need a refill on your cardiac medications before your next appointment, please call your pharmacy.

## 2021-08-09 NOTE — Progress Notes (Signed)
PCP: Ivor Reining, MD Primary Cardiologist: Dr Tamala Julian Primary EP: Dr Rayann Heman  Ricardo [name] is a 65 y.o. male who presents today for routine electrophysiology followup.  Since last being seen in our clinic, the patient reports doing very well.  Today, he denies symptoms of palpitations, chest pain, shortness of breath,  lower extremity edema, dizziness, presyncope, or syncope.  The patient is otherwise without complaint today.   Past Medical History:  Diagnosis Date   Carpal tunnel syndrome 11/03/2013   bilateral numbness-worse while sleep-remains an issue   History of kidney stones    '12 x 1 episode   Left inguinal hernia    Paroxysmal atrial fibrillation (HCC)    Prostate cancer ([place])    prostate   Renal disorder    Past Surgical History:  Procedure Laterality Date   ATRIAL FIBRILLATION ABLATION N/A 01/22/2017   Procedure: Atrial Fibrillation Ablation;  Surgeon: Thompson Grayer, MD;  Location: [place] [facility];  Service: Cardiovascular;  Laterality: N/A;   ATRIAL FIBRILLATION ABLATION  10/09/2018   ATRIAL FIBRILLATION ABLATION N/A 10/09/2018   Procedure: ATRIAL FIBRILLATION ABLATION;  Surgeon: Thompson Grayer, MD;  Location: [place] [facility];  Service: Cardiovascular;  Laterality: N/A;   COLONOSCOPY  03/2006; 07/16/2011   2007: rectal adenoma 2012:Normal   ELBOW SURGERY     bilateral ulnar nerve release   HERNIA REPAIR Left 11/06/13   [facility]   INGUINAL HERNIA REPAIR Left 11/06/2013   Procedure: HERNIA REPAIR INGUINAL ADULT;  Surgeon: Odis Hollingshead, MD;  Location: [facility];  Service: General;  Laterality: Left;   INSERTION OF MESH Left 11/06/2013   Procedure: INSERTION OF MESH;  Surgeon: Odis Hollingshead, MD;  Location: [facility];  Service: General;  Laterality: Left;   KNEE ARTHROSCOPY     left   LEFT HEART CATH AND CORONARY ANGIOGRAPHY N/A 08/11/2018   Procedure: LEFT HEART CATH AND CORONARY ANGIOGRAPHY;  Surgeon: Belva Crome, MD;  Location: [place] [facility];  Service:  Cardiovascular;  Laterality: N/A;   LOOP RECORDER INSERTION Left 11/05/2017   Procedure: LOOP RECORDER INSERTION;  Surgeon: Thompson Grayer, MD;  Location: [place] [facility];  Service: Cardiovascular;  Laterality: Left;   PROSTATE BIOPSY  05/04/2011   adenocarcinoma Gleason 6   ROBOT ASSISTED LAPAROSCOPIC RADICAL PROSTATECTOMY  09/24/2011   Procedure: ROBOTIC ASSISTED LAPAROSCOPIC RADICAL PROSTATECTOMY LEVEL 1;  Surgeon: [place] Gray, MD;  Location: [facility];  Service: Urology;  Laterality: N/A;   ULTRASOUND GUIDANCE FOR VASCULAR ACCESS  08/11/2018   Procedure: Ultrasound Guidance For Vascular Access;  Surgeon: Belva Crome, MD;  Location: [place] [facility];  Service: Cardiovascular;;    ROS- all systems are reviewed and negatives except as per HPI above  Current Outpatient Medications  Medication Sig Dispense Refill   aspirin EC 81 MG tablet Take 1 tablet (81 mg total) by mouth daily. 90 tablet 3   clotrimazole-betamethasone (LOTRISONE) cream Apply 1 application topically 2 (two) times daily as needed (for itching).   0   Coenzyme Q10 (COQ10) 100 MG CAPS Take 100 mg by mouth daily.     diltiazem (CARDIZEM CD) 120 MG 24 hr capsule TAKE 1 CAPSULE DAILY. PLEASE MAKE YEARLY APPT WITH DR. Brave Dack FOR NOVEMBER FOR FUTURE REFILLS. 90 capsule 3   diltiazem (CARDIZEM) 30 MG tablet Take 1 tablet (30 mg total) by mouth 2 (two) times daily as needed (for breakthrough afib.). 180 tablet 3   flecainide (TAMBOCOR) 50 MG tablet TAKE 1 TABLET  BY MOUTH TWICE A DAY 180 tablet 2   Multiple Vitamin (MULTIVITAMIN WITH MINERALS) TABS tablet Take 1 tablet by mouth 2 (two) times daily. DoTerra Vitamin     ondansetron (ZOFRAN ODT) 8 MG disintegrating tablet Take 1 tablet (8 mg total) by mouth every 8 (eight) hours as needed for nausea or vomiting. 20 tablet 0   oxyCODONE-acetaminophen (PERCOCET/ROXICET) 5-325 MG tablet Take 1 tablet by mouth every 6 (six) hours as needed for severe pain. 6 tablet 0   pantoprazole  (PROTONIX) 40 MG tablet Take 40 mg by mouth daily.     Polyvinyl Alcohol-Povidone (REFRESH OP) Place 1 drop into both eyes as needed (for dry eyes).     Potassium 99 MG TABS Take 99 mg by mouth daily as needed (for leg cramps in the summer once weekly).      rosuvastatin (CRESTOR) 10 MG tablet TAKE 1 TABLET BY MOUTH EVERY DAY 90 tablet 3   sildenafil (REVATIO) 20 MG tablet Take 20-60 mg by mouth daily as needed (for ED).      TURMERIC PO Take 1,500 mg by mouth daily.     No current facility-administered medications for this visit.    Physical Exam: Vitals:   08/09/21 1122  BP: 122/72  Pulse: 74  SpO2: 96%  Weight: 160 lb 3.2 oz (72.7 kg)  Height: 5\' 8"  (1.727 m)    GEN- The patient is well appearing, alert and oriented x 3 today.   Head- normocephalic, atraumatic Eyes-  Sclera clear, conjunctiva pink Ears- hearing intact Oropharynx- clear Lungs- Clear to ausculation bilaterally, normal work of breathing Heart- Regular rate and rhythm, no murmurs, rubs or gallops, PMI not laterally displaced GI- soft, NT, ND, + BS Extremities- no clubbing, cyanosis, or edema  Wt Readings from Last 3 Encounters:  08/09/21 160 lb 3.2 oz (72.7 kg)  09/12/20 156 lb (70.8 kg)  12/02/19 161 lb 12.8 oz (73.4 kg)    EKG tracing ordered today is personally reviewed and shows sinus  Assessment and Plan:  Paroxysmal atrial fibrillation Chads2vasc score is 1.  He wishes to avoid [place]. ILR is personally interrogated today.  He has had NO afib in the past year!  Continue flecainide. His ILR is at EOS.  We discussed options of removal or replacement.  He will think about this further and then let me know.  We discussed risks of ILR removal and replacement at length today, including but not limited to bleeding and infection. If he decides to proceed, we will need prior authorization.  I do feel that ILR would be very beneficial to guide [facility] decisions and AAD management going forward.  2. HL Continue  crestor 10mg  daily  Risks, benefits and potential toxicities for medications prescribed and/or refilled reviewed with patient today.   Return to see EP APP in 6 months  Thompson Grayer MD, [facility] 08/09/2021 11:25 AM

## 2021-08-21 ENCOUNTER — Ambulatory Visit: Payer: 59 | Admitting: Internal Medicine

## 2021-09-06 ENCOUNTER — Other Ambulatory Visit: Payer: Self-pay | Admitting: Urology

## 2021-09-06 DIAGNOSIS — N201 Calculus of ureter: Secondary | ICD-10-CM

## 2021-09-12 NOTE — Progress Notes (Signed)
Patient to arrive at 0600 on 09/14/2021. History and medications reviewed. Patient reports being  off ASA and supplements for approximately 1 week. Denies recent SOB or chest pain. Last EKG 08/09/2021. Pre-procedure instructions given. NPO after MN on Wednesday, except for clear liquids until 0400. Driver secured.

## 2021-09-13 NOTE — H&P (Signed)
Office Visit Report     09/06/2021   --------------------------------------------------------------------------------   Ricardo [name]  MRN: [id number]  DOB: [birth year], 65 year old Male  SSN:    PRIMARY CARE:    REFERRING:    PROVIDER:  Raynelle Bring, M.D.  LOCATION:  [facility] [phone]     --------------------------------------------------------------------------------   CC/HPI: 1. Prostate cancer  2. Erectile dysfunction  3. Urolithiasis   [name] returns today 10 years out from his radical prostatectomy. He continues to maintain good continence.   He did present to me in June with complaints of right testicular pain and it was felt that he may have a distal ureteral stone at that time. I did offer him the option of proceeding with additional imaging but he declined. His symptoms subsequently improved but then recurred and became more serious in early September. He presented to the emergency department and a CT scan was performed and this revealed a 5 to 6 mm right UVJ calculus. He was treated with pain medication and his pain has been improved. However, he has not noted passing his stone. He has had some episodes of urinary urgency and slight right lower quadrant pain since then. He is currently pain-free today.   With regard to his erectile function, he continues to utilize sildenafil 20 mg as needed and does request refills today.     ALLERGIES: No Allergies    MEDICATIONS: Aspirin 81 MG TABS Oral  Co Q-10  Diltiazem Hcl 120 mg tablet  Flecainide Acetate 50 mg tablet  Multi-Vitamin Oral Tablet Oral  Oxycodone-Acetaminophen 5 mg-325 mg tablet 1 tablet PO Q 6 H PRN  Rosuvastatin Calcium 10 mg tablet  Sildenafil Citrate 20 mg tablet 0 Oral  Turmeric  Vitamin B12     GU PSH: No GU PSH      PSH Notes: Prostatectomy Robotic-Assisted, Knee Surgery, Elbow Surgery   NON-GU PSH: No Non-GU PSH    GU PMH: Ureteral calculus, I discussed with the  patient the nature risks and benefits of continued stone passage, off label use of alpha blockers, shockwave lithotripsy or ureteroscopy. All questions answered. He will continue passage. I sent in a few more pain pills and tamsulosin. Also can take NSAID. - 07/27/2021 Flank Pain - 05/12/2021 Right testicular pain - 05/12/2021 ED due to arterial insufficiency - 08/31/2020, Erectile dysfunction due to arterial insufficiency, - 2017 History of prostate cancer - 08/31/2020, - 2019 Prostate Cancer, Prostate cancer - 2017 Renal calculus, Nephrolithiasis - 2014      PMH Notes:   1) Prostate cancer: He is s/p a BNS RAL radical prostatectomy on 09/24/11. His PSA has been undetectable since surgery.   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   Aug 2012: Spontaneously passed (calcium oxalate monohydrate)     NON-GU PMH: Atrial Fibrillation    FAMILY HISTORY: Heart Disease - Mother Prostate Cancer - Father   SOCIAL HISTORY: Marital Status: Single Preferred Language: English; Ethnicity: Not Hispanic Or Latino; Race: White Economist.  Drinks 3 caffeinated drinks per day.     Notes: Former smoker, Activities Of Daily Living, Self-reliant In Usual Daily Activities, Exercise Habits, Living Independently, Alcohol Use, Marital History - Divorced   REVIEW OF SYSTEMS:    GU Review Male:   Patient denies frequent urination,  hard to postpone urination, burning/ pain with urination, get up at night to urinate, leakage of urine, stream starts and stops, trouble starting your streams, and have to strain to urinate .  Gastrointestinal (Upper):   Patient denies nausea and vomiting.  Gastrointestinal (Lower):   Patient denies diarrhea and constipation.  Constitutional:   Patient denies fever, night sweats, weight loss,  and fatigue.  Skin:   Patient denies skin rash/ lesion and itching.  Eyes:   Patient denies blurred vision and double vision.  Ears/ Nose/ Throat:   Patient denies sore throat and sinus problems.  Hematologic/Lymphatic:   Patient denies easy bruising and swollen glands.  Cardiovascular:   Patient denies leg swelling and chest pains.  Respiratory:   Patient denies cough and shortness of breath.  Endocrine:   Patient denies excessive thirst.  Musculoskeletal:   Patient denies back pain and joint pain.  Neurological:   Patient denies headaches and dizziness.  Psychologic:   Patient denies depression and anxiety.   VITAL SIGNS:      09/06/2021 08:11 AM  Weight 160 lb / 72.57 kg  Height 68 in / 172.72 cm  BP 153/81 mmHg  Pulse 81 /min  Temperature 98.2 F / 36.7 C  BMI 24.3 kg/m   MULTI-SYSTEM PHYSICAL EXAMINATION:    Constitutional: Well-nourished. No physical deformities. Normally developed. Good grooming.  Gastrointestinal: No CVA tenderness.     Complexity of Data:  X-Ray Review: KUB: Reviewed Films.  C.T. Abdomen/Pelvis: Reviewed Films.     08/30/21 08/24/20 08/28/19 08/20/18 08/14/17 08/01/16 01/05/16 06/23/15  PSA  Total PSA <0.015 ng/mL <0.015 ng/mL <0.015 ng/mL <0.015 ng/mL <0.015 ng/mL < 0.02 ng/dl <0.01  <0.01    Notes:                     CLINICAL DATA: Right-sided flank pain   EXAM:  CT ABDOMEN AND PELVIS WITHOUT CONTRAST   TECHNIQUE:  Multidetector CT imaging of the abdomen and pelvis was performed  following the standard protocol without IV contrast.   COMPARISON: CT 06/28/2011   FINDINGS:  Lower chest: No acute abnormality.   Hepatobiliary: No focal liver abnormality is seen. No gallstones,  gallbladder wall thickening, or biliary dilatation.   Pancreas: Unremarkable. No pancreatic ductal dilatation or  surrounding inflammatory changes.   Spleen: Normal in size without focal abnormality.   Adrenals/Urinary Tract: Adrenal glands are normal. Mild to  moderate  right hydronephrosis and hydroureter, secondary to a 5 mm stone in  the distal right ureter, less than a cm proximal to the right UVJ.  Urinary bladder is empty.   Stomach/Bowel: Stomach is within normal limits. Appendix appears  normal. No evidence of bowel wall thickening, distention, or  inflammatory changes.   Vascular/Lymphatic: Mild aortic atherosclerosis. No aneurysm. No  suspicious nodes   Reproductive: Prostate is unremarkable.   Other: Negative for pelvic effusion or free air. Small fat  containing right inguinal hernia   Musculoskeletal: No acute or significant osseous findings. Scoliosis   IMPRESSION:  Mild to moderate right hydronephrosis and hydroureter, secondary to  a 5 mm stone in the distal right ureter just proximal to the right  UVJ.    Electronically Signed  By: Donavan Foil M.D.  On: 07/26/2021 20:34   I independently reviewed his KUB x-ray today. He continues to have a calcification in the vicinity of the right UVJ measuring 5 to 6 mm consistent with his known right distal ureteral calculus.   PROCEDURES:  KUB - [id number]  A single view of the abdomen is obtained.      Patient confirmed No Neulasta OnPro Device.           Urinalysis Dipstick Dipstick Cont'd  Color: Yellow Bilirubin: Neg mg/dL  Appearance: Clear Ketones: Neg mg/dL  Specific Gravity: 1.025 Blood: Neg ery/uL  pH: 6.0 Protein: Neg mg/dL  Glucose: Neg mg/dL Urobilinogen: 0.2 mg/dL    Nitrites: Neg    Leukocyte Esterase: Neg leu/uL    ASSESSMENT:      ICD-10 Details  1 GU:   Ureteral calculus - N20.1   2   Prostate Cancer - C61   3   ED due to arterial insufficiency - N52.01    PLAN:            Medications New Meds: Hydrocodone-Acetaminophen 5 mg-325 mg tablet 1-2 tablet PO Q 6 H prn   #20  0 Refill(s)            Orders X-Rays: KUB          Schedule         Document Letter(s):  Created for Patient: Clinical Summary         Notes:   1. Prostate  cancer: His PSA remains undetectable 10 years status post radical prostatectomy. Continue annual surveillance. Considering we are still dealing with his kidney stone issues, he is going to follow-up here in 1 year and then we will discuss eventually turning this over to his primary care physician.   2. Erectile dysfunction: He will continue sildenafil 20 mg as needed. I reviewed the proper use and potential side effects of sildenafil. We also discussed that generic sildenafil 20 mg for treatment of erectile dysfunction is considered "off label" use of this medication. All questions were answered to the patient's satisfaction and he was provided medication today. #270 tablets of 20 mg sildenafil.   3. Right UVJ calculus: We reviewed his KUB x-ray today which demonstrates a persistent right distal ureteral calculus. Considering this finding, I did recommend proceeding with definitive treatment. We reviewed the pros and cons of proceeding with ureteroscopic laser lithotripsy versus extracorporeal shockwave lithotripsy. Ultimately, he does elect to proceed with ESWL. This will be scheduled. The potential risks, complications, and expected recovery process have been discussed in detail. He gives informed consent to proceed.           Next Appointment:      Next Appointment: 09/14/2021 07:30 AM    Appointment Type: Surgery     Location: [facility] [phone]    Provider: Raynelle Bring, M.D.    Reason for Visit: NE/OP RT ESWL      * Signed by Raynelle Bring, M.D. on 09/06/21 at 9:27 PM (EDT)*

## 2021-09-14 ENCOUNTER — Encounter (HOSPITAL_BASED_OUTPATIENT_CLINIC_OR_DEPARTMENT_OTHER): Payer: Self-pay | Admitting: Urology

## 2021-09-14 ENCOUNTER — Ambulatory Visit (HOSPITAL_COMMUNITY): Payer: 59

## 2021-09-14 ENCOUNTER — Other Ambulatory Visit: Payer: Self-pay

## 2021-09-14 ENCOUNTER — Encounter (HOSPITAL_BASED_OUTPATIENT_CLINIC_OR_DEPARTMENT_OTHER)
Admission: RE | Disposition: A | Discharge status: Home / Self Care | Payer: Self-pay | Source: Home / Self Care | Attending: Urology

## 2021-09-14 ENCOUNTER — Ambulatory Visit (HOSPITAL_BASED_OUTPATIENT_CLINIC_OR_DEPARTMENT_OTHER)
Admission: RE | Admit: 2021-09-14 | Discharge: 2021-09-14 | Disposition: A | Discharge status: Home / Self Care | Payer: 59 | Attending: Urology | Admitting: Urology

## 2021-09-14 DIAGNOSIS — Z8249 Family history of ischemic heart disease and other diseases of the circulatory system: Secondary | ICD-10-CM | POA: Insufficient documentation

## 2021-09-14 DIAGNOSIS — I4891 Unspecified atrial fibrillation: Secondary | ICD-10-CM | POA: Insufficient documentation

## 2021-09-14 DIAGNOSIS — N132 Hydronephrosis with renal and ureteral calculous obstruction: Secondary | ICD-10-CM | POA: Insufficient documentation

## 2021-09-14 DIAGNOSIS — Z79899 Other long term (current) drug therapy: Secondary | ICD-10-CM | POA: Diagnosis not present

## 2021-09-14 DIAGNOSIS — I499 Cardiac arrhythmia, unspecified: Secondary | ICD-10-CM | POA: Diagnosis not present

## 2021-09-14 DIAGNOSIS — Z791 Long term (current) use of non-steroidal anti-inflammatories (NSAID): Secondary | ICD-10-CM | POA: Insufficient documentation

## 2021-09-14 DIAGNOSIS — Z87891 Personal history of nicotine dependence: Secondary | ICD-10-CM | POA: Insufficient documentation

## 2021-09-14 DIAGNOSIS — Z8546 Personal history of malignant neoplasm of prostate: Secondary | ICD-10-CM | POA: Diagnosis not present

## 2021-09-14 DIAGNOSIS — N201 Calculus of ureter: Secondary | ICD-10-CM

## 2021-09-14 HISTORY — PX: EXTRACORPOREAL SHOCK WAVE LITHOTRIPSY: SHX1557

## 2021-09-14 SURGERY — LITHOTRIPSY, ESWL
Anesthesia: LOCAL | Laterality: Right

## 2021-09-14 MED ORDER — CIPROFLOXACIN HCL 500 MG PO TABS
ORAL_TABLET | ORAL | Status: AC
  Filled 2021-09-14: qty 1

## 2021-09-14 MED ORDER — DIAZEPAM 5 MG PO TABS
ORAL_TABLET | ORAL | Status: AC
  Filled 2021-09-14: qty 2

## 2021-09-14 MED ORDER — DIPHENHYDRAMINE HCL 25 MG PO CAPS
25.0000 mg | ORAL_CAPSULE | ORAL | Status: AC
  Administered 2021-09-14: 25 mg via ORAL

## 2021-09-14 MED ORDER — DIPHENHYDRAMINE HCL 25 MG PO CAPS
ORAL_CAPSULE | ORAL | Status: AC
  Filled 2021-09-14: qty 1

## 2021-09-14 MED ORDER — CIPROFLOXACIN HCL 500 MG PO TABS
500.0000 mg | ORAL_TABLET | ORAL | Status: AC
  Administered 2021-09-14: 500 mg via ORAL

## 2021-09-14 MED ORDER — DIAZEPAM 5 MG PO TABS
10.0000 mg | ORAL_TABLET | ORAL | Status: AC
  Administered 2021-09-14: 10 mg via ORAL

## 2021-09-14 MED ORDER — SODIUM CHLORIDE 0.9 % IV SOLN: INTRAVENOUS | Status: DC

## 2021-09-14 MED ORDER — TAMSULOSIN HCL 0.4 MG PO CAPS
0.4000 mg | ORAL_CAPSULE | Freq: Every day | ORAL | 0 refills | Status: DC
Start: 2021-09-14 — End: 2022-08-16

## 2021-09-14 NOTE — Discharge Instructions (Addendum)
1. You should strain your urine and collect all fragments and bring them to your follow up appointment.  °2. You should take your pain medication as needed.  Please call if your pain is severe to the point that it is not controlled with your pain medication. °3. You should call if you develop fever > 101 or persistent nausea or vomiting. °4. Your doctor may prescribe tamsulosin to take to help facilitate stone passage. °

## 2021-09-14 NOTE — Op Note (Signed)
See [facility] operative note scanned into chart. Also because of the size, density, location and other factors that cannot be anticipated I feel this will likely be a staged procedure. This fact supersedes any indication in the scanned [facility] operative note to the contrary.  

## 2021-09-14 NOTE — Interval H&P Note (Signed)
History and Physical Interval Note:  09/14/2021 6:15 AM  Ricardo [name]  has presented today for surgery, with the diagnosis of RIGHT DISTAL URETERAL CALCULUS.  The various methods of treatment have been discussed with the patient and family. After consideration of risks, benefits and other options for treatment, the patient has consented to  Procedure(s): EXTRACORPOREAL SHOCK WAVE LITHOTRIPSY (ESWL) (Right) as a surgical intervention.  The patient's history has been reviewed, patient examined, no change in status, stable for surgery.  I have reviewed the patient's chart and labs.  Questions were answered to the patient's satisfaction.     [employer]

## 2021-09-14 NOTE — Progress Notes (Signed)
Right lower abd/groin area with red splotches from lithotripsy shockwave.

## 2021-09-15 ENCOUNTER — Encounter (HOSPITAL_BASED_OUTPATIENT_CLINIC_OR_DEPARTMENT_OTHER): Payer: Self-pay | Admitting: Urology

## 2021-09-24 ENCOUNTER — Other Ambulatory Visit: Payer: Self-pay | Admitting: Internal Medicine

## 2021-09-24 DIAGNOSIS — I480 Paroxysmal atrial fibrillation: Secondary | ICD-10-CM

## 2021-11-17 ENCOUNTER — Encounter: Payer: Self-pay | Admitting: Internal Medicine

## 2021-11-17 ENCOUNTER — Other Ambulatory Visit: Payer: Self-pay

## 2021-11-17 ENCOUNTER — Ambulatory Visit (INDEPENDENT_AMBULATORY_CARE_PROVIDER_SITE_OTHER): Payer: 59 | Admitting: Internal Medicine

## 2021-11-17 VITALS — BP 134/80 | HR 78 | Ht 68.0 in | Wt 159.6 lb

## 2021-11-17 DIAGNOSIS — I480 Paroxysmal atrial fibrillation: Secondary | ICD-10-CM | POA: Diagnosis not present

## 2021-11-17 DIAGNOSIS — E785 Hyperlipidemia, unspecified: Secondary | ICD-10-CM

## 2021-11-17 HISTORY — PX: OTHER SURGICAL HISTORY: SHX169

## 2021-11-17 NOTE — Progress Notes (Signed)
PCP: Ivor Reining, MD Primary Cardiologist: Dr Tamala Julian Primary EP: Dr Rayann Heman  Ricardo [name] is a 65 y.o. male who presents today for routine electrophysiology followup.  Since last being seen in our clinic, the patient reports doing very well.  Today, he denies symptoms of palpitations, chest pain, shortness of breath,  lower extremity edema, dizziness, presyncope, or syncope.  The patient is otherwise without complaint today.   Past Medical History:  Diagnosis Date   Carpal tunnel syndrome 11/03/2013   bilateral numbness-worse while sleep-remains an issue   History of kidney stones    '12 x 1 episode   Left inguinal hernia    Paroxysmal atrial fibrillation (HCC)    Prostate cancer ([place])    prostate   Renal disorder    Past Surgical History:  Procedure Laterality Date   ATRIAL FIBRILLATION ABLATION N/A 01/22/2017   Procedure: Atrial Fibrillation Ablation;  Surgeon: Thompson Grayer, MD;  Location: [place] [facility];  Service: Cardiovascular;  Laterality: N/A;   ATRIAL FIBRILLATION ABLATION  10/09/2018   ATRIAL FIBRILLATION ABLATION N/A 10/09/2018   Procedure: ATRIAL FIBRILLATION ABLATION;  Surgeon: Thompson Grayer, MD;  Location: [place] [facility];  Service: Cardiovascular;  Laterality: N/A;   COLONOSCOPY  03/2006; 07/16/2011   2007: rectal adenoma 2012:Normal   ELBOW SURGERY     bilateral ulnar nerve release   EXTRACORPOREAL SHOCK WAVE LITHOTRIPSY Right 09/14/2021   Procedure: EXTRACORPOREAL SHOCK WAVE LITHOTRIPSY (ESWL);  Surgeon: Raynelle Bring, MD;  Location: [facility];  Service: Urology;  Laterality: Right;   HERNIA REPAIR Left 11/06/13   [facility]   INGUINAL HERNIA REPAIR Left 11/06/2013   Procedure: HERNIA REPAIR INGUINAL ADULT;  Surgeon: Odis Hollingshead, MD;  Location: [facility];  Service: General;  Laterality: Left;   INSERTION OF MESH Left 11/06/2013   Procedure: INSERTION OF MESH;  Surgeon: Odis Hollingshead, MD;  Location: [facility];  Service: General;   Laterality: Left;   KNEE ARTHROSCOPY     left   LEFT HEART CATH AND CORONARY ANGIOGRAPHY N/A 08/11/2018   Procedure: LEFT HEART CATH AND CORONARY ANGIOGRAPHY;  Surgeon: Belva Crome, MD;  Location: [place] [facility];  Service: Cardiovascular;  Laterality: N/A;   LOOP RECORDER INSERTION Left 11/05/2017   Procedure: LOOP RECORDER INSERTION;  Surgeon: Thompson Grayer, MD;  Location: [place] [facility];  Service: Cardiovascular;  Laterality: Left;   PROSTATE BIOPSY  05/04/2011   adenocarcinoma Gleason 6   ROBOT ASSISTED LAPAROSCOPIC RADICAL PROSTATECTOMY  09/24/2011   Procedure: ROBOTIC ASSISTED LAPAROSCOPIC RADICAL PROSTATECTOMY LEVEL 1;  Surgeon: Dutch Gray, MD;  Location: [facility];  Service: Urology;  Laterality: N/A;   ULTRASOUND GUIDANCE FOR VASCULAR ACCESS  08/11/2018   Procedure: Ultrasound Guidance For Vascular Access;  Surgeon: Belva Crome, MD;  Location: [place] [facility];  Service: Cardiovascular;;    ROS- all systems are reviewed and negatives except as per HPI above  Current Outpatient Medications  Medication Sig Dispense Refill   clotrimazole-betamethasone (LOTRISONE) cream Apply 1 application topically 2 (two) times daily as needed (for itching).   0   Coenzyme Q10 (COQ10) 100 MG CAPS Take 100 mg by mouth daily.     diltiazem (CARDIZEM CD) 120 MG 24 hr capsule TAKE 1 CAPSULE DAILY. PLEASE MAKE YEARLY APPT WITH DR. Ryszard Socarras FOR NOVEMBER FOR FUTURE REFILLS. 90 capsule 3   diltiazem (CARDIZEM) 30 MG tablet Take 1 tablet (30 mg total) by mouth 2 (two) times daily as needed (for breakthrough  afib.). 180 tablet 3   flecainide (TAMBOCOR) 50 MG tablet TAKE 1 TABLET BY MOUTH TWICE A DAY 180 tablet 2   Multiple Vitamin (MULTIVITAMIN WITH MINERALS) TABS tablet Take 1 tablet by mouth 2 (two) times daily. [employer] Vitamin     ondansetron (ZOFRAN ODT) 8 MG disintegrating tablet Take 1 tablet (8 mg total) by mouth every 8 (eight) hours as needed for nausea or vomiting. 20 tablet 0    oxyCODONE-acetaminophen (PERCOCET/ROXICET) 5-325 MG tablet Take 1 tablet by mouth every 6 (six) hours as needed for severe pain. 6 tablet 0   pantoprazole (PROTONIX) 40 MG tablet Take 40 mg by mouth daily.     Polyvinyl Alcohol-Povidone (REFRESH OP) Place 1 drop into both eyes as needed (for dry eyes).     Potassium 99 MG TABS Take 99 mg by mouth daily as needed (for leg cramps in the summer once weekly).      rosuvastatin (CRESTOR) 10 MG tablet TAKE 1 TABLET BY MOUTH EVERY DAY 90 tablet 3   sildenafil (REVATIO) 20 MG tablet Take 20-60 mg by mouth daily as needed (for ED).      tamsulosin (FLOMAX) 0.4 MG CAPS capsule Take 1 capsule (0.4 mg total) by mouth at bedtime. 14 capsule 0   TURMERIC PO Take 1,500 mg by mouth daily.     No current facility-administered medications for this visit.    Physical Exam: Vitals:   11/17/21 0909  BP: 134/80  Pulse: 78  SpO2: 96%  Weight: 159 lb 9.6 oz (72.4 kg)  Height: 5\' 8"  (1.727 m)    GEN- The patient is well appearing, alert and oriented x 3 today.   Head- normocephalic, atraumatic Eyes-  Sclera clear, conjunctiva pink Ears- hearing intact Oropharynx- clear Lungs- Clear to ausculation bilaterally, normal work of breathing Heart- Regular rate and rhythm, no murmurs, rubs or gallops, PMI not laterally displaced GI- soft, NT, ND, + BS Extremities- no clubbing, cyanosis, or edema  Wt Readings from Last 3 Encounters:  11/17/21 159 lb 9.6 oz (72.4 kg)  09/14/21 155 lb (70.3 kg)  08/09/21 160 lb 3.2 oz (72.7 kg)    EKG tracing ordered today is personally reviewed and shows sinus rhythm  Assessment and Plan:  Paroxysmal atrial fibrillation Chads2vasc score is 1.  He wishes to avoid [place].  We have followed his arrhythmia burden with ILR.  Unfortunately, his current device is at EOS.  He and I agree that long term monitoring had been beneficial.  I would therefore advise extraction of current device with reimplantation of an implantable loop  recorder for long term arrhythmia monitoring.  Risks and benefits to ILR were discussed at length with the patient today, including but not limited to risks of bleeding and infection.  Extensive device education was performed.  Remote monitoring was also discussed at length today.  He is clear that he does not wish to be remotely monitored.  He does understand that we will be able to check the device in the office to determine AF burden but will be unable to determine rhythm when he is not connected.  Return to see EP APP in 6 months  Thompson Grayer MD, [facility] 11/17/2021 9:49 AM      DESCRIPTION OF PROCEDURE:  Informed written consent was obtained.  The patient required no sedation for the procedure today.  The patients left chest was prepped and draped.   The skin overlying the ILR monitor was infiltrated with lidocaine for local analgesia.  A 0.5-cm  incision was made over the site.  The previously implanted ILR was exposed and removed using a combination of sharp and blunt dissection.    A subcutaneous ILR pocket was fashioned using a combination of sharp and blunt dissection for the new device.  A Medtronic Reveal Linq model LNQ 22 (SN [id number]) implantable loop recorder was then placed into the pocket R waves were very prominent and measured > 0.2 mV. EBL<1 ml.  Steri- Strips and a sterile dressing were then applied.  There were no early apparent complications.     CONCLUSIONS:   1. Successful explantation of the previously placed ILR due to EOS battery status with reimplantation of a Medtronic Reveal LINQ implantable loop recorder for afib management  2. No early apparent complications.   Thompson Grayer MD, [facility] 11/17/2021 9:49 AM

## 2021-11-17 NOTE — Patient Instructions (Addendum)
Medication Instructions:  Your physician recommends that you continue on your current medications as directed. Please refer to the Current Medication list given to you today.  Labwork: None ordered.  Testing/Procedures: None ordered.  Follow-Up:  Your physician wants you to follow-up in: 6 months with Tommye Standard, PA   You will receive a reminder letter in the mail two months in advance. If you don't receive a letter, please call our office to schedule the follow-up appointment.    Implantable Loop Recorder Removal and Placement, Care After This sheet gives you information about how to care for yourself after your procedure. Your health care provider may also give you more specific instructions. If you have problems or questions, contact your health care provider. What can I expect after the procedure? After the procedure, it is common to have: Soreness or discomfort near the incision. Some swelling or bruising near the incision.  Follow these instructions at home: Incision care   Leave your outer dressing on for 24 hours.  After 24 hours you can remove your outer dressing and shower. Leave adhesive strips in place. These skin closures may need to stay in place for 1-2 weeks. If adhesive strip edges start to loosen and curl up, you may trim the loose edges.  You may remove the strips if they have not fallen off after 2 weeks. Check your incision area every day for signs of infection. Check for: Redness, swelling, or pain. Fluid or blood. Warmth. Pus or a bad smell. Do not take baths, swim, or use a hot tub until your incision is completely healed. If your wound site starts to bleed apply pressure.      If you have any questions/concerns please call the device clinic at [phone].  Activity  Return to your normal activities.  General instructions Follow instructions from your health care provider about how to manage your implantable loop recorder and transmit the  information. Learn how to activate a recording if this is necessary for your type of device. You may go through a metal detection gate, and you may let someone hold a metal detector over your chest. Show your ID card if needed. Do not have an MRI unless you check with your health care provider first. Take over-the-counter and prescription medicines only as told by your health care provider. Keep all follow-up visits as told by your health care provider. This is important. Contact a health care provider if: You have redness, swelling, or pain around your incision. You have a fever. You have pain that is not relieved by your pain medicine. You have triggered your device because of fainting (syncope) or because of a heartbeat that feels like it is racing, slow, fluttering, or skipping (palpitations). Get help right away if you have: Chest pain. Difficulty breathing. Summary After the procedure, it is common to have soreness or discomfort near the incision. Change your dressing as told by your health care provider. Follow instructions from your health care provider about how to manage your implantable loop recorder and transmit the information. Keep all follow-up visits as told by your health care provider. This is important. This information is not intended to replace advice given to you by your health care provider. Make sure you discuss any questions you have with your health care provider. Document Released: 10/17/2015 Document Revised: 12/21/2017 Document Reviewed: 12/21/2017 Elsevier Patient Education  2020 [employer].

## 2021-12-05 ENCOUNTER — Telehealth: Payer: Self-pay

## 2021-12-05 NOTE — Telephone Encounter (Signed)
The patient states that he spoke with Dr. Rayann Heman at implant that he was getting charge $160 every time he is billed. Dr. Rayann Heman told him he can come into office to get his device check.

## 2022-04-18 ENCOUNTER — Telehealth: Payer: Self-pay | Admitting: Internal Medicine

## 2022-04-18 NOTE — Telephone Encounter (Signed)
Patient is having a MRI don today.  He needs to know the make, model, type and the date he loop recorder was put in.

## 2022-04-18 NOTE — Telephone Encounter (Signed)
Returned call to Pt.  Information given.

## 2022-06-12 ENCOUNTER — Telehealth: Payer: Self-pay | Admitting: *Deleted

## 2022-06-12 NOTE — Telephone Encounter (Signed)
   Pre-operative Risk Assessment    Patient Name: Ricardo [name]  DOB: 1956-04-26 MRN: [id number]      Request for Surgical Clearance    Procedure:   RIGHT CARPAL TUNNEL RELEASE. RIGHT ULNAR NERVE RELEASE WITH ANTERIOR TRANSPORTATION AND FLEXOR PRONATOR LENGTHENING. RIGHT ELBOW OUTERBRIDGE OSTEOPLASTY WITH CAPSULECTOMY AND LOOSE BODY REMOVAL WITH REPAIR AS NECESSARY    Date of Surgery:  Clearance TBD                                 Surgeon:  DR. Roseanne Kaufman Surgeon's Group or Practice Name:  [facility] Phone number:  [phone] Fax number:  [phone] ATT: [facility] WILLS    Type of Clearance Requested:   - Medical ; NO MEDICATIONS LISTED AS NEEDING TO BE HELD   Type of Anesthesia:  General ; PER CLEARANCE REQUEST: PER ANESTHESIA, PT MUST SEE CARDIO FOR FOLLOW UP ON LOOP RECORDER   Additional requests/questions:    Jiles Prows   06/12/2022, 6:33 PM

## 2022-06-13 ENCOUNTER — Encounter: Payer: Self-pay | Admitting: Internal Medicine

## 2022-06-13 ENCOUNTER — Ambulatory Visit (INDEPENDENT_AMBULATORY_CARE_PROVIDER_SITE_OTHER): Payer: 59 | Admitting: Physician Assistant

## 2022-06-13 ENCOUNTER — Telehealth: Payer: Self-pay | Admitting: *Deleted

## 2022-06-13 DIAGNOSIS — Z01810 Encounter for preprocedural cardiovascular examination: Secondary | ICD-10-CM

## 2022-06-13 NOTE — Telephone Encounter (Signed)
Per Almyra Deforest, PAC pre op provider today to add on pt as URGENT add on. Pt has been added on 06/13/22 @ 10:40. Med rec and consent are done.   I inform the pt that the clearance request came over stating Clearance TBD, as well as a note about his loop recorder. I empathized with the pt that he is upset that his surgery may be cancelled because the requesting office and anesthesiologist do not yet have clearance from cardiology. I explained to the pt that we just got his clearance late yesterday and again with nothing stating the surgery is on 06/15/22. I explained to the pt that our job is to care for him and his heart. Pt thanked me for the call and will talk to [facility] 10:40 for pre op clearance today.   I assured the pt that I will also send an FYI to surgeon office that the pt has tele appt today at 10:40. Once cleared we will fax over clearance notes.     Patient Consent for Virtual Visit        Ricardo [name] has provided verbal consent on 06/13/2022 for a virtual visit (video or telephone).   CONSENT FOR VIRTUAL VISIT FOR:  Ricardo [name]  By participating in this virtual visit I agree to the following:  I hereby voluntarily request, consent and authorize [facility] and its employed or contracted physicians, physician assistants, nurse practitioners or other licensed health care professionals (the Practitioner), to provide me with telemedicine health care services (the "Services") as deemed necessary by the treating Practitioner. I acknowledge and consent to receive the Services by the Practitioner via telemedicine. I understand that the telemedicine visit will involve communicating with the Practitioner through live audiovisual communication technology and the disclosure of certain medical information by electronic transmission. I acknowledge that I have been given the opportunity to request an in-person assessment or other available alternative prior to the telemedicine visit and am  voluntarily participating in the telemedicine visit.  I understand that I have the right to withhold or withdraw my consent to the use of telemedicine in the course of my care at any time, without affecting my right to future care or treatment, and that the Practitioner or I may terminate the telemedicine visit at any time. I understand that I have the right to inspect all information obtained and/or recorded in the course of the telemedicine visit and may receive copies of available information for a reasonable fee.  I understand that some of the potential risks of receiving the Services via telemedicine include:  Delay or interruption in medical evaluation due to technological equipment failure or disruption; Information transmitted may not be sufficient (e.g. poor resolution of images) to allow for appropriate medical decision making by the Practitioner; and/or  In rare instances, security protocols could fail, causing a breach of personal health information.  Furthermore, I acknowledge that it is my responsibility to provide information about my medical history, conditions and care that is complete and accurate to the best of my ability. I acknowledge that Practitioner's advice, recommendations, and/or decision may be based on factors not within their control, such as incomplete or inaccurate data provided by me or distortions of diagnostic images or specimens that may result from electronic transmissions. I understand that the practice of medicine is not an exact science and that Practitioner makes no warranties or guarantees regarding treatment outcomes. I acknowledge that a copy of this consent can be made available to me via  my patient portal [facility] MyChart), or I can request a printed copy by calling the office of [facility].    I understand that my insurance will be billed for this visit.   I have read or had this consent read to me. I understand the contents of this consent, which  adequately explains the benefits and risks of the Services being provided via telemedicine.  I have been provided ample opportunity to ask questions regarding this consent and the Services and have had my questions answered to my satisfaction. I give my informed consent for the services to be provided through the use of telemedicine in my medical care

## 2022-06-13 NOTE — Progress Notes (Signed)
[facility]  Patient Information: Name:  Ricardo [name]  DOB:  [birth year]  MRN:  [id number]     RIGHT CARPAL TUNNEL RELEASE. RIGHT ULNAR NERVE RELEASE WITH ANTERIOR TRANSPORTATION AND FLEXOR PRONATOR LENGTHENING. RIGHT ELBOW OUTERBRIDGE OSTEOPLASTY WITH CAPSULECTOMY AND LOOSE BODY REMOVAL WITH REPAIR AS NECESSARY     Surgeon:  DR. Roseanne Kaufman Surgeon's Group or Practice Name:  [facility] Phone number:  [phone] Fax number:  [phone] ATT: Orson Slick   Device Information:  Clinic EP Physician:  Thompson Grayer, MD   Device Type:  Implantable Loop Recorder Manufacturer and Phone #:  Medtronic: [phone]   Electrophysiologist's Recommendations:  Patient has an implantable loop recorder this device does NOT have any pacing or defibrillation capabilities it is only a heart rhythm monitoring device. No recommendations   Per Device Clinic Standing Orders, Wanda Plump, RN  4:41 PM 06/13/2022

## 2022-06-13 NOTE — Telephone Encounter (Signed)
Per Almyra Deforest, PAC pre op provider today to add on pt as URGENT add on. Pt has been added on 06/13/22 @ 10:40. Med rec and consent are done.   I inform the pt that the clearance request came over stating Clearance TBD, as well as a note about his loop recorder. I empathized with the pt that he is upset that his surgery may be cancelled because the requesting office and anesthesiologist do not yet have clearance from cardiology. I explained to the pt that we just got his clearance late yesterday and again with nothing stating the surgery is on 06/15/22. I explained to the pt that our job is to care for him and his heart. Pt thanked me for the call and will talk to [facility] 10:40 for pre op clearance today.   I assured the pt that I will also send an FYI to surgeon office that the pt has tele appt today at 10:40. Once cleared we will fax over clearance notes.

## 2022-06-13 NOTE — Progress Notes (Signed)
Virtual Visit via Telephone Note   Because of Ricardo [name]'s co-morbid illnesses, he is at least at moderate risk for complications without adequate follow up.  This format is felt to be most appropriate for this patient at this time.  The patient did not have access to video technology/had technical difficulties with video requiring transitioning to audio format only (telephone).  All issues noted in this document were discussed and addressed.  No physical exam could be performed with this format.  Please refer to the patient's chart for his consent to telehealth for [facility].  Evaluation Performed:  Preoperative cardiovascular risk assessment _____________   Date:  06/13/2022   Patient ID:  Ricardo [name] [birth year], MRN [id number] Patient Location:  Home Provider location:   Office  Primary Care Provider:  Ivor Reining, MD Primary Cardiologist:  None Dr. Rayann Heman  Chief Complaint / Patient Profile   66 y.o. y/o male with a h/o paroxysmal atrial fibrillation not on anticoagulation therapy, prostate cancer and carpal tunnel syndrome who is pending right carpal tunnel release and right ulnar nerve release with potential additional right elbow surgery and presents today for telephonic preoperative cardiovascular risk assessment.  Past Medical History    Past Medical History:  Diagnosis Date   Carpal tunnel syndrome 11/03/2013   bilateral numbness-worse while sleep-remains an issue   History of kidney stones    '12 x 1 episode   Left inguinal hernia    Paroxysmal atrial fibrillation (HCC)    Prostate cancer ([place])    prostate   Renal disorder    Past Surgical History:  Procedure Laterality Date   ATRIAL FIBRILLATION ABLATION N/A 01/22/2017   Procedure: Atrial Fibrillation Ablation;  Surgeon: Thompson Grayer, MD;  Location: [facility];  Service: Cardiovascular;  Laterality: N/A;   ATRIAL FIBRILLATION ABLATION  10/09/2018   ATRIAL FIBRILLATION ABLATION N/A  10/09/2018   Procedure: ATRIAL FIBRILLATION ABLATION;  Surgeon: Thompson Grayer, MD;  Location: [facility];  Service: Cardiovascular;  Laterality: N/A;   COLONOSCOPY  03/2006; 07/16/2011   2007: rectal adenoma 2012:Normal   ELBOW SURGERY     bilateral ulnar nerve release   EXTRACORPOREAL SHOCK WAVE LITHOTRIPSY Right 09/14/2021   Procedure: EXTRACORPOREAL SHOCK WAVE LITHOTRIPSY (ESWL);  Surgeon: Raynelle Bring, MD;  [facility]: [facility];  Service: Urology;  Laterality: Right;   HERNIA REPAIR Left 11/06/2013   [facility]   implantable loop recorder placement  11/17/2021   removal of old ILR due to EOS battery status with new MDT [id number] implanted   INGUINAL HERNIA REPAIR Left 11/06/2013   Procedure: HERNIA REPAIR INGUINAL ADULT;  Surgeon: Odis Hollingshead, MD;  [facility]: [facility];  Service: General;  Laterality: Left;   INSERTION OF MESH Left 11/06/2013   Procedure: INSERTION OF MESH;  Surgeon: Odis Hollingshead, MD;  [facility]: [facility];  Service: General;  Laterality: Left;   KNEE ARTHROSCOPY     left   LEFT HEART CATH AND CORONARY ANGIOGRAPHY N/A 08/11/2018   Procedure: LEFT HEART CATH AND CORONARY ANGIOGRAPHY;  Surgeon: Belva Crome, MD;  Location: [facility];  Service: Cardiovascular;  Laterality: N/A;   LOOP RECORDER INSERTION Left 11/05/2017   Procedure: LOOP RECORDER INSERTION;  Surgeon: Thompson Grayer, MD;  Location: [facility];  Service: Cardiovascular;  Laterality: Left;   PROSTATE BIOPSY  05/04/2011   adenocarcinoma Gleason 6   ROBOT ASSISTED LAPAROSCOPIC RADICAL PROSTATECTOMY  09/24/2011   Procedure: ROBOTIC ASSISTED LAPAROSCOPIC RADICAL  PROSTATECTOMY LEVEL 1;  Surgeon: Dutch Gray, MD;  [facility]: [facility];  Service: Urology;  Laterality: N/A;   ULTRASOUND GUIDANCE FOR VASCULAR ACCESS  08/11/2018   Procedure: Ultrasound Guidance For Vascular Access;  Surgeon: Belva Crome, MD;  Location: [place];  Service: Cardiovascular;;    Allergies  No  Known Allergies  History of Present Illness    ONIX JUMPER is a 66 y.o. male who presents via audio/video conferencing for a telehealth visit today.  Pt was last seen in cardiology clinic on 11/17/2021 by Dr. Rayann Heman.  At that time KALIX MEINECKE was doing well.  The patient is now pending procedure as outlined above. Since his last visit, he has been doing well without significant prolonged palpitation, chest pain or worsening dyspnea.  He has pending upcoming right carpal tunnel and ulnar nerve release with potential additional elbow repair.   Home Medications    Prior to Admission medications   Medication Sig Start Date End Date Taking? Authorizing Provider  clotrimazole-betamethasone (LOTRISONE) cream Apply 1 application topically 2 (two) times daily as needed (for itching).  10/23/17   [provider]  Coenzyme Q10 (COQ10) 100 MG CAPS Take 100 mg by mouth daily.    [provider]  diltiazem (CARDIZEM CD) 120 MG 24 hr capsule TAKE 1 CAPSULE DAILY. PLEASE MAKE YEARLY APPT WITH DR. ALLRED FOR NOVEMBER FOR FUTURE REFILLS. 11/16/20   Allred, Jeneen Rinks, MD  diltiazem (CARDIZEM) 30 MG tablet Take 1 tablet (30 mg total) by mouth 2 (two) times daily as needed (for breakthrough afib.). 09/30/19   Shirley Friar, PA-C  flecainide (TAMBOCOR) 50 MG tablet TAKE 1 TABLET BY MOUTH TWICE A DAY 09/25/21   Allred, Jeneen Rinks, MD  Multiple Vitamin (MULTIVITAMIN WITH MINERALS) TABS tablet Take 1 tablet by mouth 2 (two) times daily. DoTerra Vitamin    [provider]  ondansetron (ZOFRAN ODT) 8 MG disintegrating tablet Take 1 tablet (8 mg total) by mouth every 8 (eight) hours as needed for nausea or vomiting. 07/26/21   Pattricia Boss, MD  oxyCODONE-acetaminophen (PERCOCET/ROXICET) 5-325 MG tablet Take 1 tablet by mouth every 6 (six) hours as needed for severe pain. 07/26/21   Pattricia Boss, MD  pantoprazole (PROTONIX) 40 MG tablet Take 40 mg by mouth daily. 06/29/21   [provider]  Polyvinyl Alcohol-Povidone (REFRESH OP) Place 1 drop into both eyes as needed (for dry eyes).    [provider]  Potassium 99 MG TABS Take 99 mg by mouth daily as needed (for leg cramps in the summer once weekly).     [provider]  rosuvastatin (CRESTOR) 10 MG tablet TAKE 1 TABLET BY MOUTH EVERY DAY 11/03/20   Allred, Jeneen Rinks, MD  sildenafil (REVATIO) 20 MG tablet Take 20-60 mg by mouth daily as needed (for ED).     [provider]  tamsulosin (FLOMAX) 0.4 MG CAPS capsule Take 1 capsule (0.4 mg total) by mouth at bedtime. 09/14/21   Raynelle Bring, MD  TURMERIC PO Take 1,500 mg by mouth daily.    [provider]    Physical Exam    Vital Signs:  SHNEUR WHITTENBURG does not have vital signs available for review today.  Given telephonic nature of communication, physical exam is limited. AAOx3. NAD. Normal affect.  Speech and respirations are unlabored.  Accessory Clinical Findings    None  Assessment & Plan    1.  Preoperative Cardiovascular Risk Assessment:  -Patient has history of PAF  with loop recorder placed.  Due to rarity of A-fib, he is not on anticoagulation therapy.  He denies any recent exertional chest pain, worsening dyspnea, or prolonged palpitation.  He clearly is able to accomplish more than 4 METS of activity.  He is at acceptable risk to proceed with upcoming low risk surgery.  A copy of this note will be routed to requesting surgeon.  Time:   Today, I have spent 4 minutes with the patient with telehealth technology discussing medical history, symptoms, and management plan.     [place], [place]  06/13/2022, 10:35 AM

## 2022-06-13 NOTE — Telephone Encounter (Signed)
Please arrange virtual telephone visit for today if the surgery is Friday.

## 2022-06-13 NOTE — Telephone Encounter (Signed)
   Name: Ricardo [name]  DOB: 1956/09/24  MRN: [id number]   Primary Cardiologist: None Primary electrophysiologist: Dr. Rayann Heman  Chart reviewed as part of pre-operative protocol coverage. Patient was contacted 06/13/2022 in reference to pre-operative risk assessment for pending surgery as outlined below.  Ricardo [name] was last seen on 11/17/2021 by Dr. Rayann Heman.  Since that day, Ricardo [name] has done well without exertional chest pain or worsening dyspnea.  He is not on anticoagulation therapy given very recurrence of A-fib and low CHA2DS2-Vasc score.  He has no problem achieving 4 METS level of activity.  Therefore, based on ACC/AHA guidelines, the patient would be at acceptable risk for the planned procedure without further cardiovascular testing.   The patient was advised that if he develops new symptoms prior to surgery to contact our office to arrange for a follow-up visit, and he verbalized understanding.  I will route this recommendation to the requesting party via Epic fax function and remove from pre-op pool. Please call with questions.  [place], [place] 06/13/2022, 10:50 AM

## 2022-06-13 NOTE — Telephone Encounter (Signed)
Patient is following up, stating his procedure is scheduled for 7/28 and he would like to expedite this process if at all possible. He assumes an appointment will be needed and I offered 7/31, but he declined--he is requesting an appointment today. I informed him the pre-op team will call him back when able.

## 2022-07-04 ENCOUNTER — Other Ambulatory Visit: Payer: Self-pay | Admitting: Internal Medicine

## 2022-07-04 DIAGNOSIS — I480 Paroxysmal atrial fibrillation: Secondary | ICD-10-CM

## 2022-08-01 ENCOUNTER — Telehealth: Payer: Self-pay | Admitting: Internal Medicine

## 2022-08-01 NOTE — Telephone Encounter (Signed)
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

## 2022-08-02 ENCOUNTER — Telehealth: Payer: Self-pay | Admitting: Internal Medicine

## 2022-08-02 NOTE — Telephone Encounter (Signed)
Spoke with pt and advised he is past due for his 81mof/u per Dr ARayann Heman  Appointment scheduled for 08/16/2022 with AOda Kiltsat 820am.  He will need DOT letter that day if cleared.  Pt verbalizes understanding and agrees with current plan.

## 2022-08-02 NOTE — Telephone Encounter (Signed)
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 He needs this letter asap please.

## 2022-08-08 NOTE — Progress Notes (Signed)
Electrophysiology Office Note Date: 08/16/2022  ID:  Ricardo, [name] 1956-08-18, MRN [id number]  PCP: Ivor Reining, MD Primary Cardiologist: None Electrophysiologist: Dr. Rayann Heman -> Dr. Curt Bears  CC: ILR follow-up  Ricardo [name] is a 66 y.o. male seen today for Dr. Curt Bears . he presents today for routine electrophysiology followup. Since last being seen in our clinic the patient reports doing very well.  he denies chest pain, palpitations, dyspnea, PND, orthopnea, nausea, vomiting, dizziness, syncope, edema, weight gain, or early satiety. .  Device History: Medtronic loop recorder implanted 11/2021 for Atrial fibrillation  Past Medical History:  Diagnosis Date   Carpal tunnel syndrome 11/03/2013   bilateral numbness-worse while sleep-remains an issue   History of kidney stones    '12 x 1 episode   Left inguinal hernia    Paroxysmal atrial fibrillation (HCC)    Prostate cancer ([place])    prostate   Renal disorder    Past Surgical History:  Procedure Laterality Date   ATRIAL FIBRILLATION ABLATION N/A 01/22/2017   Procedure: Atrial Fibrillation Ablation;  Surgeon: Thompson Grayer, MD;  Location: [facility];  Service: Cardiovascular;  Laterality: N/A;   ATRIAL FIBRILLATION ABLATION  10/09/2018   ATRIAL FIBRILLATION ABLATION N/A 10/09/2018   Procedure: ATRIAL FIBRILLATION ABLATION;  Surgeon: Thompson Grayer, MD;  Location: [facility];  Service: Cardiovascular;  Laterality: N/A;   COLONOSCOPY  03/2006; 07/16/2011   2007: rectal adenoma 2012:Normal   ELBOW SURGERY     bilateral ulnar nerve release   EXTRACORPOREAL SHOCK WAVE LITHOTRIPSY Right 09/14/2021   Procedure: EXTRACORPOREAL SHOCK WAVE LITHOTRIPSY (ESWL);  Surgeon: Raynelle Bring, MD;  Location: [facility];  Service: Urology;  Laterality: Right;   HERNIA REPAIR Left 11/06/2013   [facility]   implantable loop recorder placement  11/17/2021   removal of old ILR due to EOS battery status with new MDT  [id number] implanted   INGUINAL HERNIA REPAIR Left 11/06/2013   Procedure: HERNIA REPAIR INGUINAL ADULT;  Surgeon: Odis Hollingshead, MD;  [facility];  Service: General;  Laterality: Left;   INSERTION OF MESH Left 11/06/2013   Procedure: INSERTION OF MESH;  Surgeon: Odis Hollingshead, MD;  [facility];  Service: General;  Laterality: Left;   KNEE ARTHROSCOPY     left   LEFT HEART CATH AND CORONARY ANGIOGRAPHY N/A 08/11/2018   Procedure: LEFT HEART CATH AND CORONARY ANGIOGRAPHY;  Surgeon: Belva Crome, MD;  Location: [place] [facility];  Service: Cardiovascular;  Laterality: N/A;   LOOP RECORDER INSERTION Left 11/05/2017   Procedure: LOOP RECORDER INSERTION;  Surgeon: Thompson Grayer, MD;  Location: [place] [facility];  Service: Cardiovascular;  Laterality: Left;   PROSTATE BIOPSY  05/04/2011   adenocarcinoma Gleason 6   ROBOT ASSISTED LAPAROSCOPIC RADICAL PROSTATECTOMY  09/24/2011   Procedure: ROBOTIC ASSISTED LAPAROSCOPIC RADICAL PROSTATECTOMY LEVEL 1;  Surgeon: Dutch Gray, MD;  [facility];  Service: Urology;  Laterality: N/A;   ULTRASOUND GUIDANCE FOR VASCULAR ACCESS  08/11/2018   Procedure: Ultrasound Guidance For Vascular Access;  Surgeon: Belva Crome, MD;  Location: [place] [facility];  Service: Cardiovascular;;    Current Outpatient Medications  Medication Sig Dispense Refill   clotrimazole-betamethasone (LOTRISONE) cream Apply 1 application topically 2 (two) times daily as needed (for itching).   0   Coenzyme Q10 (COQ10) 100 MG CAPS Take 100 mg by mouth daily.     diltiazem (CARDIZEM CD) 120 MG 24 hr capsule TAKE 1  CAPSULE DAILY. PLEASE MAKE YEARLY APPT WITH DR. ALLRED FOR NOVEMBER FOR FUTURE REFILLS. 90 capsule 3   diltiazem (CARDIZEM) 30 MG tablet Take 1 tablet (30 mg total) by mouth 2 (two) times daily as needed (for breakthrough afib.). 180 tablet 3   flecainide (TAMBOCOR) 50 MG tablet TAKE 1 TABLET BY MOUTH TWICE A DAY 180 tablet 2   Multiple Vitamin  (MULTIVITAMIN WITH MINERALS) TABS tablet Take 1 tablet by mouth 2 (two) times daily. DoTerra Vitamin     pantoprazole (PROTONIX) 40 MG tablet Take 40 mg by mouth daily.     Polyvinyl Alcohol-Povidone (REFRESH OP) Place 1 drop into both eyes as needed (for dry eyes).     Potassium 99 MG TABS Take 99 mg by mouth daily as needed (for leg cramps in the summer once weekly).      Pyridoxine HCl (VITAMIN B6 PO) Take by mouth daily.     rosuvastatin (CRESTOR) 10 MG tablet TAKE 1 TABLET BY MOUTH EVERY DAY 90 tablet 3   sildenafil (REVATIO) 20 MG tablet Take 20-60 mg by mouth daily as needed (for ED).      TURMERIC PO Take 1,500 mg by mouth daily.     No current facility-administered medications for this visit.    Allergies:   Patient has no known allergies.   Social History: Social History   Socioeconomic History   Marital status: Divorced    Spouse name: Not on file   Number of children: Not on file   Years of education: Not on file   Highest education level: Not on file  Occupational History   Not on file  Tobacco Use   Smoking status: Former    Types: Cigarettes    Quit date: 09/17/2001    Years since quitting: 20.9   Smokeless tobacco: Never  Vaping Use   Vaping Use: Never used  Substance and Sexual Activity   Alcohol use: Yes    Alcohol/week: 12.0 standard drinks of alcohol    Types: 5 Glasses of wine, 7 Cans of beer per week    Comment: daily   Drug use: No   Sexual activity: Yes  Other Topics Concern   Not on file  Social History Narrative   Lives in [place]   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  Physical Exam: Vitals:   08/16/22 0836  BP: 134/76  Pulse: 72  Weight: 160 lb (72.6 kg)  Height: '5\' 8"'$  (1.727 m)     GEN- The patient is well appearing, alert and oriented x 3 today.   HEENT: normocephalic, atraumatic; sclera clear, conjunctiva pink; hearing intact; oropharynx clear; neck supple  Lungs- Clear to ausculation bilaterally, normal work of breathing.  No wheezes, rales, rhonchi Heart- Regular rate and rhythm, no murmurs, rubs or gallops  GI- soft, non-tender, non-distended, bowel sounds present  Extremities- no clubbing, cyanosis, or edema  MS- no significant deformity or atrophy Skin- warm and dry, no rash or lesion; ILR pocket well  healed Psych- euthymic mood, full affect Neuro- strength and sensation are intact  PPM Interrogation- reviewed in detail today,  See PACEART report  EKG:  EKG is ordered today. The ekg ordered today shows NSR at 72 bpm, stable intervals compared to EKG 11/17/21  Recent Labs: No results found for requested labs within last 365 days.   Wt Readings from Last 3 Encounters:  08/16/22 160 lb (72.6 kg)  11/17/21 159 lb 9.6 oz (72.4 kg)  09/14/21 155 lb (70.3 kg)     Other studies Reviewed: Additional studies/ records that were reviewed today include: Previous EP office notes, Previous remote checks, Most recent labwork.   Assessment and Plan:  1. Paroxysmal atrial fibrillation Has h/o ablation CHA2DS2VASC of 1  (Non obstructive CAD) -> Not diabetic Continue flecainide 50 mg BID Burden <0.1%.    2. 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    Labs/ tests ordered today include:  Orders Placed This Encounter  Procedures   Basic metabolic panel   CBC   EKG 12-Lead     Disposition:   Follow up with  Dr. Curt Bears  in 12 Months, to establish from Dr. Rayann Heman   Signed, Shirley Friar, PA-C  08/16/2022 9:42 AM  [facility] [address] [place] [address] [phone] (office) [phone] (fax)

## 2022-08-16 ENCOUNTER — Ambulatory Visit: Payer: 59 | Attending: Student | Admitting: Student

## 2022-08-16 ENCOUNTER — Encounter: Payer: Self-pay | Admitting: Student

## 2022-08-16 VITALS — BP 134/76 | HR 72 | Ht 68.0 in | Wt 160.0 lb

## 2022-08-16 DIAGNOSIS — I480 Paroxysmal atrial fibrillation: Secondary | ICD-10-CM

## 2022-08-16 DIAGNOSIS — Z01810 Encounter for preprocedural cardiovascular examination: Secondary | ICD-10-CM

## 2022-08-16 LAB — BASIC METABOLIC PANEL
BUN/Creatinine Ratio: 18 (ref 10–24)
BUN: 16 mg/dL (ref 8–27)
CO2: 26 mmol/L (ref 20–29)
Calcium: 9.9 mg/dL (ref 8.6–10.2)
Chloride: 107 mmol/L — ABNORMAL HIGH (ref 96–106)
Creatinine, Ser: 0.9 mg/dL (ref 0.76–1.27)
Glucose: 85 mg/dL (ref 70–99)
Potassium: 4.3 mmol/L (ref 3.5–5.2)
Sodium: 141 mmol/L (ref 134–144)
eGFR: 94 mL/min/{1.73_m2} (ref >59)

## 2022-08-16 LAB — CUP PACEART INCLINIC DEVICE CHECK
Date Time Interrogation Session: 20230928094152
Implantable Pulse Generator Implant Date: 20221230

## 2022-08-16 LAB — CBC
Hematocrit: 45.7 % (ref 37.5–51.0)
Hemoglobin: 15.6 g/dL (ref 13.0–17.7)
MCH: 33 pg (ref 26.6–33.0)
MCHC: 34.1 g/dL (ref 31.5–35.7)
MCV: 97 fL (ref 79–97)
Platelets: 170 10*3/uL (ref 150–450)
RBC: 4.73 x10E6/uL (ref 4.14–5.80)
RDW: 13.1 % (ref 11.6–15.4)
WBC: 5.3 10*3/uL (ref 3.4–10.8)

## 2022-08-16 NOTE — Patient Instructions (Signed)
Medication Instructions:  Your physician recommends that you continue on your current medications as directed. Please refer to the Current Medication list given to you today.  *If you need a refill on your cardiac medications before your next appointment, please call your pharmacy*   Lab Work: TODAY: BMET, CBC  If you have labs (blood work) drawn today and your tests are completely normal, you will receive your results only by: [place] (if you have MyChart) OR A paper copy in the mail If you have any lab test that is abnormal or we need to change your treatment, we will call you to review the results.   Follow-Up: At [facility], you and your health needs are our priority.  As part of our continuing mission to provide you with exceptional heart care, we have created designated Provider Care Teams.  These Care Teams include your primary Cardiologist (physician) and Advanced Practice Providers (APPs -  Physician Assistants and Nurse Practitioners) who all work together to provide you with the care you need, when you need it.  We recommend signing up for the patient portal called "MyChart".  Sign up information is provided on this After Visit Summary.  MyChart is used to connect with patients for Virtual Visits (Telemedicine).  Patients are able to view lab/test results, encounter notes, upcoming appointments, etc.  Non-urgent messages can be sent to your provider as well.   To learn more about what you can do with MyChart, go to [email or web address].    Your next appointment:   1 year(s)  The format for your next appointment:   In Person  Provider:   Allegra Lai, MD    Important Information About Sugar

## 2023-02-13 ENCOUNTER — Other Ambulatory Visit: Payer: Self-pay

## 2023-02-13 MED ORDER — ROSUVASTATIN CALCIUM 10 MG PO TABS: 10.0000 mg | ORAL_TABLET | Freq: Every day | ORAL | 1 refills | Status: DC

## 2023-02-13 MED ORDER — DILTIAZEM HCL ER COATED BEADS 120 MG PO CP24: ORAL_CAPSULE | ORAL | 1 refills | Status: AC

## 2023-04-10 ENCOUNTER — Other Ambulatory Visit: Payer: Self-pay

## 2023-04-10 DIAGNOSIS — I480 Paroxysmal atrial fibrillation: Secondary | ICD-10-CM

## 2023-04-10 MED ORDER — FLECAINIDE ACETATE 50 MG PO TABS: 50.0000 mg | ORAL_TABLET | Freq: Two times a day (BID) | ORAL | 1 refills | Status: DC

## 2023-07-11 ENCOUNTER — Other Ambulatory Visit: Payer: Self-pay | Admitting: Cardiology

## 2023-08-14 ENCOUNTER — Encounter: Payer: 59 | Admitting: Cardiology

## 2023-09-05 ENCOUNTER — Other Ambulatory Visit: Payer: Self-pay | Admitting: Cardiology

## 2023-09-08 NOTE — Progress Notes (Unsigned)
Electrophysiology Office Note:   Date:  09/10/2023  ID:  Ronik, Macgillivray Apr 06, 1956, MRN [id number]  Primary Cardiologist: None Electrophysiologist: Sybilla Malhotra Jorja Loa, MD      History of Present Illness:   Ricardo [name] is a 67 y.o. male with h/o atrial fibrillation seen today for routine electrophysiology followup.   Since last being seen in our clinic the patient reports doing overall well.  He has noted no further episodes of atrial fibrillation.  He is able to do all his daily activities without restriction.  Happy with his control..  he denies chest pain, palpitations, dyspnea, PND, orthopnea, nausea, vomiting, dizziness, syncope, edema, weight gain, or early satiety.   Review of systems complete and found to be negative unless listed in HPI.   EP Information / Studies Reviewed:    EKG is ordered today. Personal review as below.  EKG Interpretation Date/Time:  Tuesday September 10 2023 15:07:58 EDT Ventricular Rate:  75 PR Interval:  154 QRS Duration:  94 QT Interval:  402 QTC Calculation: 448 R Axis:   58  Text Interpretation: Normal sinus rhythm Normal ECG When compared with ECG of 03-Nov-2018 08:55, Sinus rhythm Confirmed by Magalene Mclear ([id number]) on 09/10/2023 3:09:43 PM     Risk Assessment/Calculations:    CHA2DS2-VASc Score = 1   This indicates a 0.6% annual risk of stroke. The patient's score is based upon: CHF History: 0 HTN History: 0 Diabetes History: 0 Stroke History: 0 Vascular Disease History: 0 Age Score: 1 Gender Score: 0              Physical Exam:   VS:  BP 120/76 (BP Location: Left Arm, Patient Position: Sitting, Cuff Size: Normal)   Pulse 77   Ht 5\' 8"  (1.727 m)   Wt 157 lb 9.6 oz (71.5 kg)   SpO2 98%   BMI 23.96 kg/m    Wt Readings from Last 3 Encounters:  09/10/23 157 lb 9.6 oz (71.5 kg)  08/16/22 160 lb (72.6 kg)  11/17/21 159 lb 9.6 oz (72.4 kg)     GEN: Well nourished, well developed in no acute distress NECK: No JVD;  No carotid bruits CARDIAC: , no murmurs, rubs, gallops RESPIRATORY:  Clear to auscultation without rales, wheezing or rhonchi  ABDOMEN: Soft, non-tender, non-distended EXTREMITIES:  No edema; No deformity   ASSESSMENT AND PLAN:    1.  Paroxysmal atrial fibrillation: Post ablation in 2019.  Currently on flecainide.  Post Medtronic Linq implanted in 2023.  Minimal episodes of atrial fibrillation.  Continue with current management.  2.  Nonobstructive coronary artery disease: 40 to 50% on cath in 2019.  No current chest pain.  Follow up with EP APP in 12 months  Signed, Lance Huaracha Jorja Loa, MD

## 2023-09-10 ENCOUNTER — Encounter: Payer: Self-pay | Admitting: Cardiology

## 2023-09-10 ENCOUNTER — Ambulatory Visit: Payer: Medicare HMO | Attending: Cardiology | Admitting: Cardiology

## 2023-09-10 VITALS — BP 120/76 | HR 77 | Ht 68.0 in | Wt 157.6 lb

## 2023-09-10 DIAGNOSIS — I2510 Atherosclerotic heart disease of native coronary artery without angina pectoris: Secondary | ICD-10-CM | POA: Diagnosis not present

## 2023-09-10 DIAGNOSIS — I480 Paroxysmal atrial fibrillation: Secondary | ICD-10-CM

## 2023-10-09 ENCOUNTER — Other Ambulatory Visit: Payer: Self-pay | Admitting: Cardiology

## 2023-10-09 DIAGNOSIS — I480 Paroxysmal atrial fibrillation: Secondary | ICD-10-CM

## 2023-12-03 ENCOUNTER — Telehealth: Payer: Self-pay

## 2023-12-03 ENCOUNTER — Telehealth: Payer: Self-pay | Admitting: *Deleted

## 2023-12-03 NOTE — Telephone Encounter (Signed)
   Name: Ricardo [name]  DOB: 02-02-1956  MRN: [id number]  Primary Cardiologist: None   Preoperative team, please contact this patient and set up a phone call appointment for further preoperative risk assessment. Please obtain consent and complete medication review. Thank you for your help.  I confirm that guidance regarding antiplatelet and oral anticoagulation therapy has been completed and, if necessary, noted below.  Per office protocol, if patient is without any new symptoms or concerns at the time of their virtual visit, he/she may hold ASA for 7 days prior to procedure. Please resume ASA as soon as possible postprocedure, at the discretion of the surgeon.    I also confirmed the patient resides in the state of [place] . As per [facility] Medical Board telemedicine laws, the patient must reside in the state in which the provider is licensed.   [name], NP 12/03/2023, 3:02 PM [facility]

## 2023-12-03 NOTE — Telephone Encounter (Signed)
 Patient has been added for tomorrow per Ricardo Satterfield, FNP consent and med rec done     Patient Consent for Virtual Visit         SAHID [name] has provided verbal consent on 12/03/2023 for a virtual visit (video or telephone).   CONSENT FOR VIRTUAL VISIT FOR:  Ricardo [name] Her  By participating in this virtual visit I agree to the following:  I hereby voluntarily request, consent and authorize [facility] and its employed or contracted physicians, physician assistants, nurse practitioners or other licensed health care professionals (the Practitioner), to provide me with telemedicine health care services (the "Services) as deemed necessary by the treating Practitioner. I acknowledge and consent to receive the Services by the Practitioner via telemedicine. I understand that the telemedicine visit will involve communicating with the Practitioner through live audiovisual communication technology and the disclosure of certain medical information by electronic transmission. I acknowledge that I have been given the opportunity to request an in-person assessment or other available alternative prior to the telemedicine visit and am voluntarily participating in the telemedicine visit.  I understand that I have the right to withhold or withdraw my consent to the use of telemedicine in the course of my care at any time, without affecting my right to future care or treatment, and that the Practitioner or I may terminate the telemedicine visit at any time. I understand that I have the right to inspect all information obtained and/or recorded in the course of the telemedicine visit and may receive copies of available information for a reasonable fee.  I understand that some of the potential risks of receiving the Services via telemedicine include:  Delay or interruption in medical evaluation due to technological equipment failure or disruption; Information transmitted may not be sufficient (e.g. poor  resolution of images) to allow for appropriate medical decision making by the Practitioner; and/or  In rare instances, security protocols could fail, causing a breach of personal health information.  Furthermore, I acknowledge that it is my responsibility to provide information about my medical history, conditions and care that is complete and accurate to the best of my ability. I acknowledge that Practitioner's advice, recommendations, and/or decision may be based on factors not within their control, such as incomplete or inaccurate data provided by me or distortions of diagnostic images or specimens that may result from electronic transmissions. I understand that the practice of medicine is not an exact science and that Practitioner makes no warranties or guarantees regarding treatment outcomes. I acknowledge that a copy of this consent can be made available to me via my patient portal [facility] MyChart), or I can request a printed copy by calling the office of [facility].    I understand that my insurance will be billed for this visit.   I have read or had this consent read to me. I understand the contents of this consent, which adequately explains the benefits and risks of the Services being provided via telemedicine.  I have been provided ample opportunity to ask questions regarding this consent and the Services and have had my questions answered to my satisfaction. I give my informed consent for the services to be provided through the use of telemedicine in my medical care

## 2023-12-03 NOTE — Telephone Encounter (Signed)
 Our office only received clearance request yesterday 12/02/23 for procedure 12/06/23. Pt is very upset as to why did we just receive this request a few days before his surgery. We advised the pt that he will need to d/w the surgeon office as to why we just received request. Ok per Ricardo Satterfield, DNP ok to add pt tomorrow 11 am.

## 2023-12-03 NOTE — Telephone Encounter (Signed)
   Pre-operative Risk Assessment    Patient Name: Ricardo [name]  DOB: 01-May-1956 MRN: [id number]   Date of last office visit: 09/10/23 DR. CAMNITZ Date of next office visit: NONE   Request for Surgical Clearance    Procedure:   LEFT ELBOW EVALUATION UNDER ANESTHESIA WITH CAPSULAR RELEASE AND CAPSULECTOMY AND OUTERBRIDGE OSTEOPLASTY WITH REPAIR AS NECESSARY  Date of Surgery:  Clearance 12/06/23                                Surgeon:  DR. ELSIE MUSSEL Surgeon's Group or Practice Name:  [facility] Phone number:  [phone] Fax number:  [phone] ATTN: JOEN SIC   Type of Clearance Requested:   - Medical  - Pharmacy:  Hold Aspirin      Type of Anesthesia:  General    Additional requests/questions:    [name]   12/03/2023, 10:13 AM

## 2023-12-05 ENCOUNTER — Ambulatory Visit: Payer: Medicare Other | Attending: Internal Medicine | Admitting: Nurse Practitioner

## 2023-12-05 DIAGNOSIS — Z01810 Encounter for preprocedural cardiovascular examination: Secondary | ICD-10-CM | POA: Diagnosis not present

## 2023-12-05 NOTE — Progress Notes (Signed)
Virtual Visit via Telephone Note   Because of Ricardo [name]'s co-morbid illnesses, he is at least at moderate risk for complications without adequate follow up.  This format is felt to be most appropriate for this patient at this time.  The patient did not have access to video technology/had technical difficulties with video requiring transitioning to audio format only (telephone).  All issues noted in this document were discussed and addressed.  No physical exam could be performed with this format.  Please refer to the patient's chart for his consent to telehealth for [facility].  Evaluation Performed:  Preoperative cardiovascular risk assessment _____________   Date:  12/05/2023   Patient ID:  Ricardo [name], Ricardo [name] [birth year], MRN [id number] Patient Location:  Home Provider location:   Office  Primary Care Provider:  Lafe Garin, [name] Primary Cardiologist:  None  Chief Complaint / Patient Profile   68 y.o. y/o male with a h/o paroxysmal atrial fibrillation, nonobstructive CAD, prostate cancer, and carpal tunnel syndrome who is pending LEFT ELBOW EVALUATION UNDER ANESTHESIA WITH CAPSULAR RELEASE AND CAPSULECTOMY AND OUTERBRIDGE OSTEOPLASTY WITH REPAIR AS NECESSARY with Dr. Dominica [name] of [facility] and presents today for telephonic preoperative cardiovascular risk assessment.  History of Present Illness    Ricardo [name] is a 68 y.o. male who presents via audio/video conferencing for a telehealth visit today.  Pt was last seen in cardiology clinic on 09/10/2023 by Ricardo [name].  At that time Ricardo [name] was doing well.  The patient is now pending procedure as outlined above. Since his last visit, he has done well from a cardiac standpoint.   He denies chest pain, palpitations, dyspnea, pnd, orthopnea, n, v, dizziness, syncope, edema, weight gain, or early satiety. All other systems reviewed and are otherwise negative except as noted above.   Past Medical History     Past Medical History:  Diagnosis Date   Carpal tunnel syndrome 11/03/2013   bilateral numbness-worse while sleep-remains an issue   History of kidney stones    '12 x 1 episode   Left inguinal hernia    Paroxysmal atrial fibrillation (HCC)    Prostate cancer (HCC)    prostate   Renal disorder    Past Surgical History:  Procedure Laterality Date   ATRIAL FIBRILLATION ABLATION N/A 01/22/2017   Procedure: Atrial Fibrillation Ablation;  Surgeon: Ricardo [name];  [facility] INVASIVE CV LAB;  Service: Cardiovascular;  Laterality: N/A;   ATRIAL FIBRILLATION ABLATION  10/09/2018   ATRIAL FIBRILLATION ABLATION N/A 10/09/2018   Procedure: ATRIAL FIBRILLATION ABLATION;  Surgeon: Ricardo [name];  Location: [name];  Service: Cardiovascular;  Laterality: N/A;   COLONOSCOPY  03/2006; 07/16/2011   2007: rectal adenoma 2012:Normal   ELBOW SURGERY     bilateral ulnar nerve release   EXTRACORPOREAL SHOCK WAVE LITHOTRIPSY Right 09/14/2021   Procedure: EXTRACORPOREAL SHOCK WAVE LITHOTRIPSY (ESWL);  Surgeon: Ricardo Purpura, [name];  Location: [facility];  Service: Urology;  Laterality: Right;   HERNIA REPAIR Left 11/06/2013   [facility]   implantable loop recorder placement  11/17/2021   removal of old ILR due to EOS battery status with new MDT [id number] implanted   INGUINAL HERNIA REPAIR Left 11/06/2013   Procedure: HERNIA REPAIR INGUINAL ADULT;  Surgeon: Ricardo Pollack, [name];  Location: [facility];  Service: General;  Laterality: Left;   INSERTION OF MESH Left 11/06/2013   Procedure: INSERTION OF MESH;  Surgeon: Ricardo Pollack, [name];  Location: [facility];  Service: General;  Laterality: Left;   KNEE ARTHROSCOPY     left   LEFT HEART CATH AND CORONARY ANGIOGRAPHY N/A 08/11/2018   Procedure: LEFT HEART CATH AND CORONARY ANGIOGRAPHY;  Surgeon: Ricardo Records, [name];  Location: [name];  Service: Cardiovascular;  Laterality: N/A;   LOOP RECORDER INSERTION Left 11/05/2017    Procedure: LOOP RECORDER INSERTION;  Surgeon: Ricardo [name];  Location: [name];  Service: Cardiovascular;  Laterality: Left;   PROSTATE BIOPSY  05/04/2011   adenocarcinoma Gleason 6   ROBOT ASSISTED LAPAROSCOPIC RADICAL PROSTATECTOMY  09/24/2011   Procedure: ROBOTIC ASSISTED LAPAROSCOPIC RADICAL PROSTATECTOMY LEVEL 1;  Surgeon: Ricardo [name];  Location: [facility];  Service: Urology;  Laterality: N/A;   ULTRASOUND GUIDANCE FOR VASCULAR ACCESS  08/11/2018   Procedure: Ultrasound Guidance For Vascular Access;  Surgeon: Ricardo Records, [name];  Location: [facility] INVASIVE CV LAB;  Service: Cardiovascular;;    Allergies  No Known Allergies  Home Medications    Prior to Admission medications   Medication Sig Start Date End Date Taking? Authorizing Provider  aspirin EC 81 MG tablet Take 81 mg by mouth daily. Swallow whole.    Provider, Historical, [name]  clotrimazole-betamethasone (LOTRISONE) cream Apply 1 application topically 2 (two) times daily as needed (for itching).  10/23/17   Provider, Historical, [name]  Coenzyme Q10 (COQ10) 100 MG CAPS Take 100 mg by mouth daily.    Provider, Historical, [name]  diltiazem (CARDIZEM CD) 120 MG 24 hr capsule TAKE 1 CAPSULE DAILY. PLEASE MAKE YEARLY APPT WITH DR. ALLRED FOR NOVEMBER FOR FUTURE REFILLS. 02/13/23   [name], Ricardo Coss, [name]  diltiazem (CARDIZEM) 30 MG tablet Take 1 tablet (30 mg total) by mouth 2 (two) times daily as needed (for breakthrough afib.). 09/30/19   Ricardo Freer, PA-C  flecainide (TAMBOCOR) 50 MG tablet TAKE 1 TABLET BY MOUTH TWICE A DAY 10/10/23   [name], Ricardo Coss, [name]  Multiple Vitamin (MULTIVITAMIN WITH MINERALS) TABS tablet Take 1 tablet by mouth 2 (two) times daily. DoTerra Vitamin    Provider, Historical, [name]  pantoprazole (PROTONIX) 40 MG tablet Take 40 mg by mouth daily. 06/29/21   Provider, Historical, [name]  Polyvinyl Alcohol-Povidone (REFRESH OP) Place 1 drop into both eyes as needed (for dry eyes).    Provider,  Historical, [name]  Potassium 99 MG TABS Take 99 mg by mouth daily as needed (for leg cramps in the summer once weekly).     Provider, Historical, [name]  Pyridoxine HCl (VITAMIN B6 PO) Take by mouth daily.    Provider, Historical, [name]  rosuvastatin (CRESTOR) 10 MG tablet TAKE 1 TABLET BY MOUTH EVERY DAY 09/05/23   [name], Ricardo Coss, [name]  sildenafil (REVATIO) 20 MG tablet Take 20-60 mg by mouth daily as needed (for ED).     Provider, Historical, [name]  TURMERIC PO Take 1,500 mg by mouth daily.    Provider, Historical, [name]    Physical Exam    Vital Signs:  ZACHARI FAROOQI does not have vital signs available for review today.  Given telephonic nature of communication, physical exam is limited. AAOx3. NAD. Normal affect.  Speech and respirations are unlabored.  Accessory Clinical Findings    None  Assessment & Plan    1.  Preoperative Cardiovascular Risk Assessment:  According to the Revised Cardiac Risk Index (RCRI), his Perioperative Risk of Major Cardiac Event is (%): 0.4. His Functional Capacity in METs is: 8.97 according to the Duke Activity Status Index (  DASI). Therefore, based on ACC/AHA guidelines, patient would be at acceptable risk for the planned procedure without further cardiovascular testing.   The patient was advised that if he develops new symptoms prior to surgery to contact our office to arrange for a follow-up visit, and he verbalized understanding.  Per office protocol, he may hold ASA for 7 days prior to procedure. Please resume ASA as soon as possible postprocedure, at the discretion of the surgeon.     A copy of this note will be routed to requesting surgeon.  Time:   Today, I have spent 5 minutes with the patient with telehealth technology discussing medical history, symptoms, and management plan.     Joylene Grapes, NP  12/05/2023, 11:09 AM
